# Patient Record
Sex: Female | Born: 1955 | Race: White | Hispanic: No | Marital: Married | Smoking: Never smoker
Health system: Southern US, Community
[De-identification: ages and names within clinical notes are randomized; demographics above are authoritative.]

## PROBLEM LIST (undated history)

## (undated) DIAGNOSIS — E785 Hyperlipidemia, unspecified: Secondary | ICD-10-CM

## (undated) DIAGNOSIS — I341 Nonrheumatic mitral (valve) prolapse: Secondary | ICD-10-CM

## (undated) DIAGNOSIS — R091 Pleurisy: Secondary | ICD-10-CM

## (undated) DIAGNOSIS — C539 Malignant neoplasm of cervix uteri, unspecified: Secondary | ICD-10-CM

## (undated) DIAGNOSIS — C73 Malignant neoplasm of thyroid gland: Secondary | ICD-10-CM

## (undated) HISTORY — DX: Malignant neoplasm of thyroid gland: C73

## (undated) HISTORY — DX: Hyperlipidemia, unspecified: E78.5

## (undated) HISTORY — DX: Malignant neoplasm of cervix uteri, unspecified: C53.9

## (undated) HISTORY — PX: OTHER SURGICAL HISTORY: SHX169

## (undated) HISTORY — DX: Nonrheumatic mitral (valve) prolapse: I34.1

## (undated) HISTORY — DX: Pleurisy: R09.1

---

## 1976-08-17 HISTORY — PX: TONSILLECTOMY: SHX5217

## 1980-08-17 DIAGNOSIS — R091 Pleurisy: Secondary | ICD-10-CM

## 1980-08-17 HISTORY — DX: Pleurisy: R09.1

## 1984-08-17 DIAGNOSIS — C539 Malignant neoplasm of cervix uteri, unspecified: Secondary | ICD-10-CM

## 1984-08-17 HISTORY — DX: Malignant neoplasm of cervix uteri, unspecified: C53.9

## 1998-02-17 ENCOUNTER — Emergency Department (HOSPITAL_COMMUNITY): Admission: EM | Admit: 1998-02-17 | Discharge: 1998-02-17 | Payer: Self-pay | Admitting: Emergency Medicine

## 1998-11-25 ENCOUNTER — Other Ambulatory Visit: Admission: RE | Admit: 1998-11-25 | Discharge: 1998-11-25 | Payer: Self-pay | Admitting: Obstetrics & Gynecology

## 2000-06-10 ENCOUNTER — Other Ambulatory Visit: Admission: RE | Admit: 2000-06-10 | Discharge: 2000-06-10 | Payer: Self-pay | Admitting: Obstetrics and Gynecology

## 2000-06-25 ENCOUNTER — Encounter: Payer: Self-pay | Admitting: Obstetrics and Gynecology

## 2000-06-25 ENCOUNTER — Ambulatory Visit (HOSPITAL_COMMUNITY): Admission: RE | Admit: 2000-06-25 | Discharge: 2000-06-25 | Payer: Self-pay | Admitting: Obstetrics and Gynecology

## 2002-07-28 ENCOUNTER — Other Ambulatory Visit: Admission: RE | Admit: 2002-07-28 | Discharge: 2002-07-28 | Payer: Self-pay | Admitting: Obstetrics and Gynecology

## 2004-01-05 ENCOUNTER — Other Ambulatory Visit: Payer: Self-pay

## 2004-07-15 ENCOUNTER — Other Ambulatory Visit: Admission: RE | Admit: 2004-07-15 | Discharge: 2004-07-15 | Payer: Self-pay | Admitting: Obstetrics and Gynecology

## 2005-08-17 DIAGNOSIS — C73 Malignant neoplasm of thyroid gland: Secondary | ICD-10-CM

## 2005-08-17 HISTORY — PX: THYROIDECTOMY: SHX17

## 2005-08-17 HISTORY — DX: Malignant neoplasm of thyroid gland: C73

## 2006-05-10 ENCOUNTER — Encounter: Admission: RE | Admit: 2006-05-10 | Discharge: 2006-05-10 | Payer: Self-pay | Admitting: Internal Medicine

## 2006-05-19 ENCOUNTER — Other Ambulatory Visit: Admission: RE | Admit: 2006-05-19 | Discharge: 2006-05-19 | Payer: Self-pay | Admitting: Interventional Radiology

## 2006-05-19 ENCOUNTER — Encounter: Admission: RE | Admit: 2006-05-19 | Discharge: 2006-05-19 | Payer: Self-pay | Admitting: Internal Medicine

## 2006-05-19 ENCOUNTER — Encounter (INDEPENDENT_AMBULATORY_CARE_PROVIDER_SITE_OTHER): Payer: Self-pay | Admitting: Specialist

## 2006-07-01 ENCOUNTER — Ambulatory Visit (HOSPITAL_COMMUNITY): Admission: RE | Admit: 2006-07-01 | Discharge: 2006-07-02 | Payer: Self-pay | Admitting: Surgery

## 2006-07-01 ENCOUNTER — Encounter (INDEPENDENT_AMBULATORY_CARE_PROVIDER_SITE_OTHER): Payer: Self-pay | Admitting: *Deleted

## 2006-07-27 ENCOUNTER — Encounter (HOSPITAL_COMMUNITY): Admission: RE | Admit: 2006-07-27 | Discharge: 2006-08-02 | Payer: Self-pay | Admitting: Endocrinology

## 2006-07-30 ENCOUNTER — Ambulatory Visit (HOSPITAL_COMMUNITY): Admission: RE | Admit: 2006-07-30 | Discharge: 2006-07-30 | Payer: Self-pay | Admitting: Endocrinology

## 2006-08-07 ENCOUNTER — Emergency Department (HOSPITAL_COMMUNITY): Admission: EM | Admit: 2006-08-07 | Discharge: 2006-08-08 | Payer: Self-pay | Admitting: Emergency Medicine

## 2007-07-09 IMAGING — US US BIOPSY
1 series · 5 of 5 positions shown · non-contrast
Comparison: Diagnostic ultrasound dated 05/10/06.

CLINICAL DATA: Dominant cystic and solid nodule of the left lobe of the thyroid gland.  The patient now presents for biopsy.
 ULTRASOUND GUIDED NEEDLE ASPIRATE BIOPSY OF THE THYROID:
TECHNIQUE: Prior to the procedure informed consent was obtained.  Initial imaging was performed by ultrasound to localize a left thyroid nodule.  Overlying skin was sterilely prepped and draped.  Local anesthesia was provided with 1% Lidocaine. 
 Three 25 gauge needle aspirates were performed in the solid portion of the dominant solid and cystic thyroid nodule.  Material was placed on slides for cytologic analysis.  The fluid component was then aspirated with a 4th 25 gauge needle.  After collapse of the cystic portion the fluid was also additionally sent for cytology.

[Series 1: unknown · 0.06mm/px · 5 of 5 slices shown]
[im 1/5]
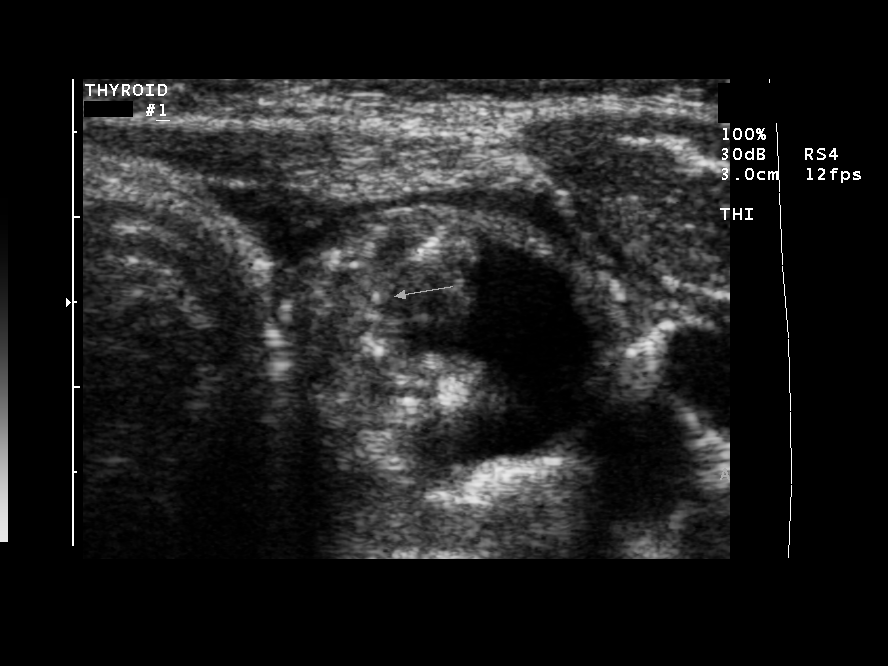
[im 2/5]
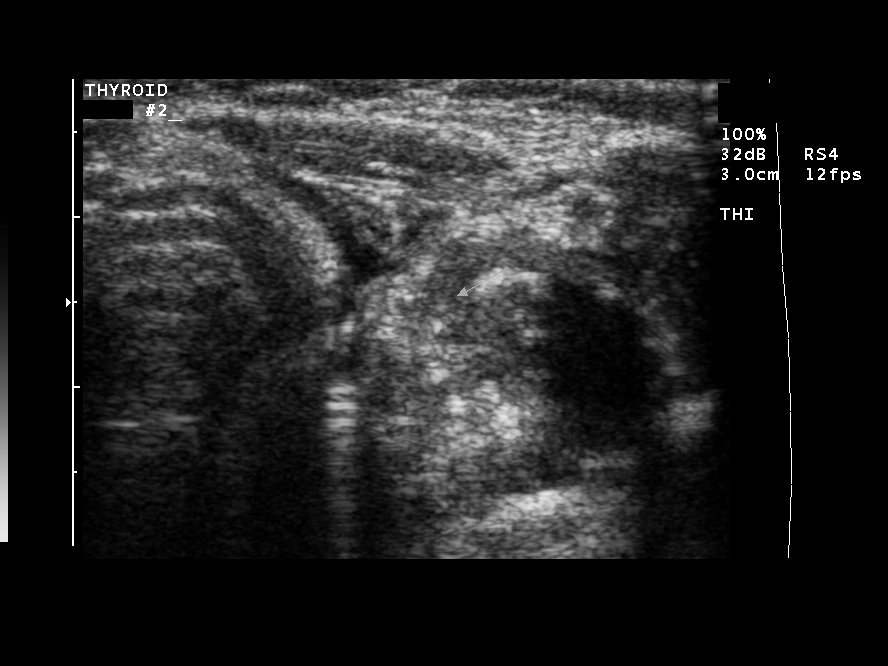
[im 3/5]
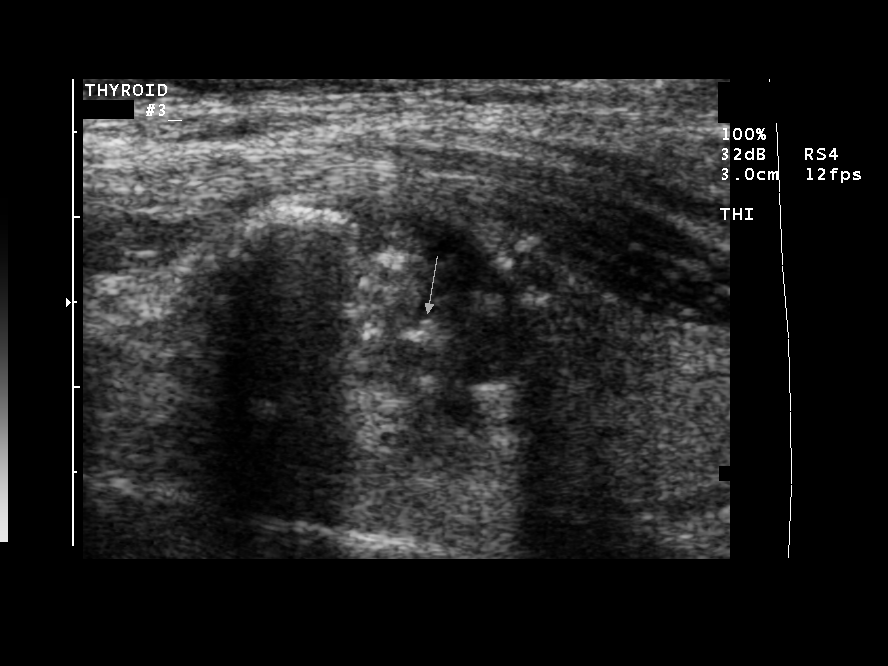
[im 4/5]
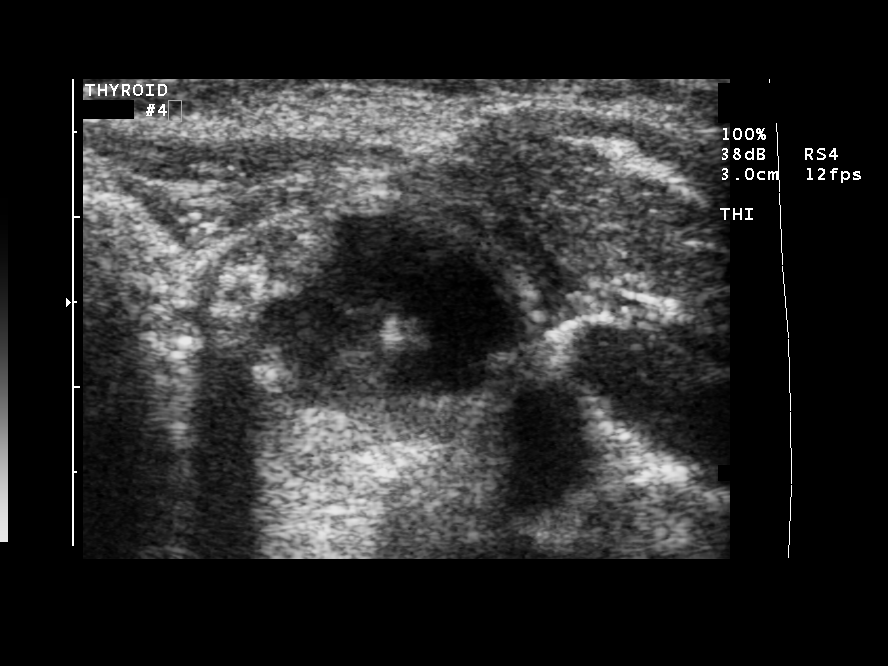
[im 5/5]
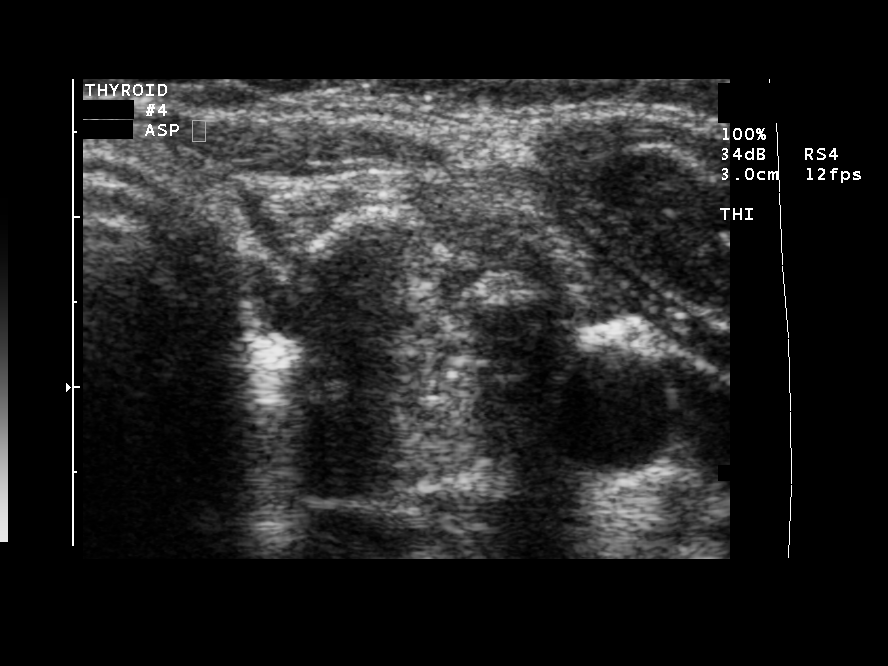

[5 of 5 positions shown; findings below may reference images not displayed]

FINDINGS: Again demonstrated is an ovoid dominant nodule with a polypoid solid portion containing internal calcification and a peripheral cystic portion.  The rim of the nodule also demonstrates areas of calcification.  Needle aspirates were performed in three different portions of the solid aspect of the nodule.  This was followed by cyst fluid aspiration revealing 3 cc of blood tinged fluid.  This resulted in complete collapse of the cystic portion of the mass.
IMPRESSION: Ultrasound guided thyroid biopsy with needle aspirates performed of both solid and cystic portions of a complex nodule of the left lobe.  Cytology is pending.

## 2007-08-08 ENCOUNTER — Encounter (HOSPITAL_COMMUNITY): Admission: RE | Admit: 2007-08-08 | Discharge: 2007-08-15 | Payer: Self-pay | Admitting: Internal Medicine

## 2007-09-28 IMAGING — CR DG CHEST 1V PORT
1 series · 1 of 1 positions shown · non-contrast
Comparison: none

HISTORY: Chest pain

PORTABLE CHEST ONE VIEW:
Portable exam 6666 hours compared to 06/29/2006
Normal heart size mediastinal contours and pulmonary vascularity.
Lungs clear.
No pleural effusion or pneumothorax.
Multiple cardiac monitoring lines project over chest.

[view not recorded]
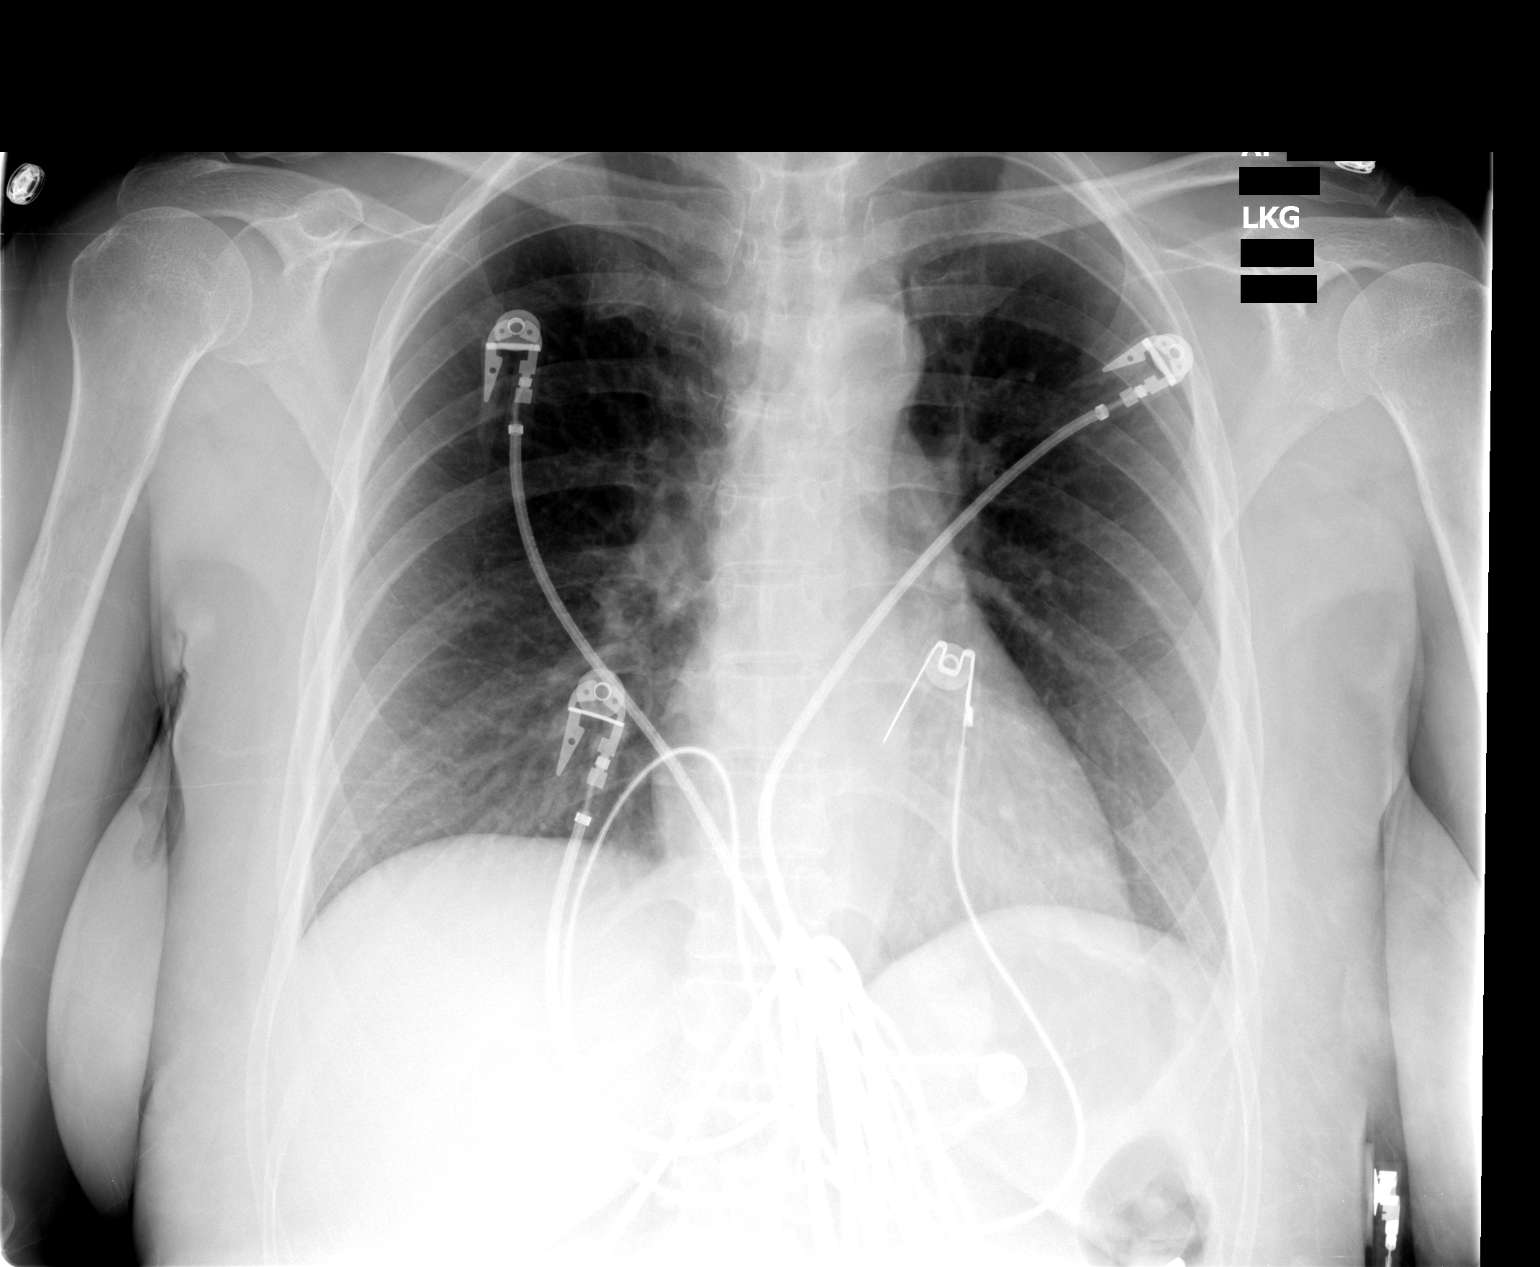

[1 of 1 positions shown; findings below may reference images not displayed]

IMPRESSION: No acute abnormalities.

## 2008-12-15 ENCOUNTER — Emergency Department: Payer: Self-pay | Admitting: Emergency Medicine

## 2010-09-07 ENCOUNTER — Encounter: Payer: Self-pay | Admitting: Endocrinology

## 2011-01-02 NOTE — Op Note (Signed)
Claire Smith, Claire Smith NO.:  [id number]   MEDICAL RECORD NO.:  [id number]          PATIENT TYPE:  OIB   LOCATION:  5706                         FACILITY:  [facility]   PHYSICIAN:  Velora Heckler, MD      DATE OF BIRTH:  October 02, 1955   DATE OF PROCEDURE:  07/01/2006  DATE OF DISCHARGE:                                 OPERATIVE REPORT   PREOPERATIVE DIAGNOSIS:  Left thyroid nodule with cytologic atypia worrisome  for papillary thyroid carcinoma.   POSTOPERATIVE DIAGNOSIS:  Left thyroid nodule with cytologic atypia  worrisome for papillary thyroid carcinoma.   PROCEDURE:  Total thyroidectomy.   SURGEON:  Velora Heckler, M.D., FACS   ASSISTANT:  Currie Paris, M.D., FACS   ANESTHESIA:  General per Dr. Diamantina Monks.   ESTIMATED BLOOD LOSS:  Minimal.   PREPARATION:  Betadine.   COMPLICATIONS:  None.   INDICATIONS:  The patient is a 56 year old white female from [place], [place].  She has had a known thyroid nodule for approximately 15 years.  The patient underwent thyroid ultrasound in September 2007 showing a complex  nodule in the left thyroid lobe.  Fine needle aspiration was performed and  showed findings suspicious for papillary thyroid carcinoma.  The patient now  comes to surgery for resection.   BODY OF REPORT:  The procedure is done in OR 16 at [facility].  The  patient is brought to the operating room and placed in the supine position  on the operating room table.  Following administration of general  anesthesia, the patient is prepped and draped in the usual strict aseptic  fashion.  After ascertaining that an adequate level of anesthesia had been  obtained, a Kocher incision was made with a #15 blade.  Dissection was  carried down through subcutaneous tissues and platysma.  Hemostasis was  obtained with electrocautery.  Skin flaps were elevated cephalad and caudad  from the thyroid notch to the sternal notch.  A Mahorner  self-retaining  retractor was placed for exposure.  Strap muscles are incised in the  midline.  Dissection is begun on the left side of the neck.  Strap muscles  were reflected laterally.  The nodule was quite large and occupies a large  portion of the left thyroid lobe.  The strap muscles were somewhat adherent  to the nodule but did not appear to be directly inflated.  They were gently  dissected away from the thyroid capsule.  The left lobe was mobilized.  Venous tributaries were divided with the harmonic scalpel.  The middle  thyroid vein is divided with the harmonic scalpel.  The superior pole  vessels are dissected out and transected with the harmonic scalpel with good  hemostasis noted.  The superior parathyroid gland on the left is identified  and preserved.  Branches of the inferior thyroid artery are divided between  small Liga clips.  The gland is rolled anteriorly.  Gentle dissection  reveals the recurrent nerve which was identified and preserved.  The gland  is rolled further anteriorly.  The inferior venous tributaries were divided  with harmonic scalpel.  The ligament of Allyson Sabal is transected with  electrocautery and the isthmus is mobilized up and onto the anterior  trachea.  A medium size pyramidal lobe was dissected out with the harmonic  scalpel and included with specimen.  Dissection was carried across the  midline and the isthmus is transected with the harmonic scalpel at its  junction with the right thyroid lobe.  The left lobe and isthmus and  pyramidal lobe were submitted as one specimen to pathology.  Dr. Renato Battles did a frozen section biopsy on the nodule.  She states that this is a  calcified nodule worrisome for papillary carcinoma but based on frozen  section, she is not able to make a definitive diagnosis.  Therefore, final  diagnosis will be deferred to permanent sections.  Good hemostasis was  obtained in the left neck and a pack is placed in the left  neck.   Next, we turned our attention to the right thyroid lobe.  The strap muscles  are reflected laterally and the lobe was exposed.  There are no dominant  masses in the right thyroid lobe.  The middle thyroid vein is divided with  harmonic scalpel.  The superior pole is dissected out.  The superior pole  vessels were taken down using the harmonic scalpel with good hemostasis  noted.  The gland is rolled anteriorly.  The inferior venous tributaries  were divided between small Liga clips.  The inferior parathyroid tissue was  identified and preserved.  Branches of the inferior thyroid artery are  divided between small Liga clips.  The gland is rolled anteriorly up and  onto the anterior trachea.  The ligament of Allyson Sabal is transected with  electrocautery.  The gland is excised off the trachea with the harmonic  scalpel and the right thyroid lobe is submitted for permanent sections only.  The neck is thoroughly palpated.  No significant lymphadenopathy is  identified.  The central compartment was opened and visually inspected  without evidence of lymphadenopathy.  There is no jugular adenopathy  palpable.  The neck is irrigated with saline.  Good hemostasis was obtained  bilaterally.  Surgicel was placed over the area of the recurrent nerves and  parathyroid glands bilaterally.  The strap muscles were reapproximated in  the midline with interrupted 3-0 Vicryl sutures.  The platysma was closed  with interrupted 3-0 Vicryl sutures.  The skin was closed with a running 4-0  Vicryl subcuticular suture.  The skin is washed and dried and Benzoin and  Steri-Strips are applied.  Sterile dressings were applied.  The patient is  awakened from anesthesia and brought to the recovery room in stable  condition.  The patient tolerated the procedure well.      Velora Heckler, MD  Electronically Signed     TMG/MEDQ  D:  07/01/2006  T:  07/01/2006  Job:  [id number]   cc:   Gwen Pounds, MD

## 2011-06-01 ENCOUNTER — Ambulatory Visit
Admission: RE | Admit: 2011-06-01 | Discharge: 2011-06-01 | Disposition: A | Discharge status: Home / Self Care | Payer: BC Managed Care – PPO | Source: Ambulatory Visit | Attending: Internal Medicine | Admitting: Internal Medicine

## 2011-06-01 ENCOUNTER — Other Ambulatory Visit: Payer: Self-pay | Admitting: Internal Medicine

## 2011-06-01 DIAGNOSIS — Z1231 Encounter for screening mammogram for malignant neoplasm of breast: Secondary | ICD-10-CM

## 2011-09-06 ENCOUNTER — Emergency Department: Payer: Self-pay | Admitting: Emergency Medicine

## 2012-09-23 ENCOUNTER — Other Ambulatory Visit: Payer: Self-pay | Admitting: Internal Medicine

## 2012-09-23 DIAGNOSIS — Z1231 Encounter for screening mammogram for malignant neoplasm of breast: Secondary | ICD-10-CM

## 2012-10-18 ENCOUNTER — Ambulatory Visit
Admission: RE | Admit: 2012-10-18 | Discharge: 2012-10-18 | Disposition: A | Discharge status: Home / Self Care | Payer: BC Managed Care – PPO | Source: Ambulatory Visit | Attending: Internal Medicine | Admitting: Internal Medicine

## 2012-10-18 DIAGNOSIS — Z1231 Encounter for screening mammogram for malignant neoplasm of breast: Secondary | ICD-10-CM

## 2013-01-19 ENCOUNTER — Encounter: Payer: Self-pay | Admitting: Gastroenterology

## 2014-05-07 ENCOUNTER — Other Ambulatory Visit: Payer: Self-pay

## 2014-05-07 DIAGNOSIS — Z1231 Encounter for screening mammogram for malignant neoplasm of breast: Secondary | ICD-10-CM

## 2014-05-15 ENCOUNTER — Ambulatory Visit
Admission: RE | Admit: 2014-05-15 | Discharge: 2014-05-15 | Disposition: A | Discharge status: Home / Self Care | Payer: BC Managed Care – PPO | Source: Ambulatory Visit

## 2014-05-15 DIAGNOSIS — Z1231 Encounter for screening mammogram for malignant neoplasm of breast: Secondary | ICD-10-CM

## 2014-06-26 ENCOUNTER — Encounter: Payer: Self-pay | Admitting: Internal Medicine

## 2014-08-23 ENCOUNTER — Encounter: Payer: BC Managed Care – PPO | Admitting: Internal Medicine

## 2015-10-14 ENCOUNTER — Encounter: Payer: Self-pay | Admitting: Internal Medicine

## 2016-01-20 DIAGNOSIS — E038 Other specified hypothyroidism: Secondary | ICD-10-CM | POA: Diagnosis not present

## 2016-01-20 DIAGNOSIS — C73 Malignant neoplasm of thyroid gland: Secondary | ICD-10-CM | POA: Diagnosis not present

## 2016-01-20 DIAGNOSIS — M810 Age-related osteoporosis without current pathological fracture: Secondary | ICD-10-CM | POA: Diagnosis not present

## 2016-01-20 DIAGNOSIS — R42 Dizziness and giddiness: Secondary | ICD-10-CM | POA: Diagnosis not present

## 2016-01-20 DIAGNOSIS — E784 Other hyperlipidemia: Secondary | ICD-10-CM | POA: Diagnosis not present

## 2016-03-27 DIAGNOSIS — N950 Postmenopausal bleeding: Secondary | ICD-10-CM | POA: Diagnosis not present

## 2016-04-16 ENCOUNTER — Encounter: Payer: Self-pay | Admitting: Internal Medicine

## 2016-04-27 ENCOUNTER — Encounter (INDEPENDENT_AMBULATORY_CARE_PROVIDER_SITE_OTHER): Payer: Self-pay

## 2016-04-27 ENCOUNTER — Ambulatory Visit (INDEPENDENT_AMBULATORY_CARE_PROVIDER_SITE_OTHER): Payer: BLUE CROSS/BLUE SHIELD | Admitting: Internal Medicine

## 2016-04-27 ENCOUNTER — Encounter: Payer: Self-pay | Admitting: Internal Medicine

## 2016-04-27 VITALS — BP 114/76 | HR 80 | Ht 61.81 in | Wt 148.0 lb

## 2016-04-27 DIAGNOSIS — K625 Hemorrhage of anus and rectum: Secondary | ICD-10-CM | POA: Diagnosis not present

## 2016-04-27 DIAGNOSIS — Z1211 Encounter for screening for malignant neoplasm of colon: Secondary | ICD-10-CM

## 2016-04-27 DIAGNOSIS — R1084 Generalized abdominal pain: Secondary | ICD-10-CM

## 2016-04-27 MED ORDER — NA SULFATE-K SULFATE-MG SULF 17.5-3.13-1.6 GM/177ML PO SOLN: 1.0000 | Freq: Once | ORAL | 0 refills | Status: AC

## 2016-04-27 NOTE — Patient Instructions (Signed)
You have been scheduled for a colonoscopy. Please follow written instructions given to you at your visit today.  Please pick up your prep supplies at the pharmacy within the next 1-3 days. If you use inhalers (even only as needed), please bring them with you on the day of your procedure.   

## 2016-04-27 NOTE — Progress Notes (Signed)
HISTORY OF PRESENT ILLNESS:  Claire Smith is a 60 y.o. female who is referred by Dr. Shon Baton regarding minor rectal bleeding and the need for colonoscopy. The patient has not had previous colonoscopy. She reports occasional minor blood on the stool. This is bright. Infrequent minor rectal discomfort. No change in bowel habits or weight. She does mention some vague lower abdominal discomfort which she has had off-and-on for a few years. Previously evaluated by PCP with negative ultrasound. She cannot identify any exacerbating or relieving factors. She does state that this is dull and not severe. She has a history of thyroid cancer but is doing well.  REVIEW OF SYSTEMS:  All non-GI ROS negative except for  Past Medical History:  Diagnosis Date  . Cervical cancer ([place]) 1986  . HLD (hyperlipidemia)   . MVP (mitral valve prolapse)   . Pleurisy 1982  . Thyroid cancer ([place]) 2007    Past Surgical History:  Procedure Laterality Date  . radioactive iodine     thyroid cancer  . THYROIDECTOMY  2007  . TONSILLECTOMY  1978    Social History Claire Smith  reports that she has never smoked. She has never used smokeless tobacco. She reports that she does not drink alcohol or use drugs.  family history includes Bladder Cancer (age of onset: 16) in her father; Breast cancer in her paternal grandmother; Heart Problems in her father; Heart disease in her paternal uncle; Prostate cancer in her paternal uncle; Rheumatic fever in her father.  Allergies  Allergen Reactions  . Erythromycin Nausea Only       PHYSICAL EXAMINATION: Vital signs: BP 114/76 (BP Location: Left Arm, Patient Position: Sitting, Cuff Size: Normal)   Pulse 80   Ht 5' 1.81" (1.57 m) Comment: height measured without shoes  Wt 148 lb (67.1 kg)   BMI 27.24 kg/m   Constitutional: generally well-appearing, no acute distress Psychiatric: alert and oriented x3, cooperative Eyes: extraocular movements intact, anicteric,  conjunctiva pink Mouth: oral pharynx moist, no lesions Neck: supple no lymphadenopathy Cardiovascular: heart regular rate and rhythm, no murmur Lungs: clear to auscultation bilaterally Abdomen: soft, nontender, nondistended, no obvious ascites, no peritoneal signs, normal bowel sounds, no organomegaly Rectal:Deferred until colonoscopy Extremities: no clubbing cyanosis or lower extremity edema bilaterally Skin: no lesions on visible extremities Neuro: No focal deficits. Normal DTRs  ASSESSMENT:  #1. Minor rectal bleeding #2. Vague minor intermittent lower abdominal discomfort. Chronic and stable #3. Screening colonoscopy. Appropriate candidate without contraindication   PLAN:  #1. Colonoscopy.The nature of the procedure, as well as the risks, benefits, and alternatives were carefully and thoroughly reviewed with the patient. Ample time for discussion and questions allowed. The patient understood, was satisfied, and agreed to proceed.

## 2016-04-28 ENCOUNTER — Encounter: Payer: Self-pay | Admitting: Internal Medicine

## 2016-04-29 ENCOUNTER — Telehealth: Payer: Self-pay | Admitting: Internal Medicine

## 2016-04-29 NOTE — Telephone Encounter (Signed)
Called pharmacy and gave them the $50 coupon information for Suprep.  Pharmacy ran coupon successfully.  Called patient and told her she could pick it up.  Patient agreed.

## 2016-05-04 ENCOUNTER — Ambulatory Visit (AMBULATORY_SURGERY_CENTER): Payer: BLUE CROSS/BLUE SHIELD | Admitting: Internal Medicine

## 2016-05-04 ENCOUNTER — Encounter: Payer: Self-pay | Admitting: Internal Medicine

## 2016-05-04 VITALS — BP 116/64 | HR 68 | Temp 98.0°F | Resp 11 | Ht 61.0 in | Wt 148.0 lb

## 2016-05-04 DIAGNOSIS — Z1211 Encounter for screening for malignant neoplasm of colon: Secondary | ICD-10-CM | POA: Diagnosis not present

## 2016-05-04 DIAGNOSIS — D120 Benign neoplasm of cecum: Secondary | ICD-10-CM

## 2016-05-04 DIAGNOSIS — D125 Benign neoplasm of sigmoid colon: Secondary | ICD-10-CM

## 2016-05-04 MED ORDER — SODIUM CHLORIDE 0.9 % IV SOLN: 500.0000 mL | INTRAVENOUS | Status: AC

## 2016-05-04 NOTE — Progress Notes (Signed)
Report to PACU, RN, vss, BBS= Clear.  

## 2016-05-04 NOTE — Progress Notes (Signed)
Called to room for pathology. 

## 2016-05-04 NOTE — Op Note (Signed)
[facility] Patient Name: Claire Smith Procedure Date: 05/04/2016 12:48 PM MRN: [id number] Endoscopist: Docia Chuck. Henrene Pastor , MD Age: 60 Referring MD:  Date of Birth: [birth year] Gender: Female Account #: [id number] Procedure:                Colonoscopy, with cold snare polypectomy x 1 Indications:              Screening for colorectal malignant neoplasm Medicines:                Monitored Anesthesia Care Procedure:                Pre-Anesthesia Assessment:                           - Prior to the procedure, a History and Physical                            was performed, and patient medications and                            allergies were reviewed. The patient's tolerance of                            previous anesthesia was also reviewed. The risks                            and benefits of the procedure and the sedation                            options and risks were discussed with the patient.                            All questions were answered, and informed consent                            was obtained. Prior Anticoagulants: The patient has                            taken no previous anticoagulant or antiplatelet                            agents. ASA Grade Assessment: II - A patient with                            mild systemic disease. After reviewing the risks                            and benefits, the patient was deemed in                            satisfactory condition to undergo the procedure.                           After obtaining informed consent, the colonoscope  was passed under direct vision. Throughout the                            procedure, the patient's blood pressure, pulse, and                            oxygen saturations were monitored continuously. The                            Model CF-HQ190L [id number] scope was introduced                            through the anus and advanced to the the cecum,                   identified by appendiceal orifice and ileocecal                            valve. The ileocecal valve, appendiceal orifice,                            and rectum were photographed. The quality of the                            bowel preparation was excellent. The colonoscopy                            was performed without difficulty. The patient                            tolerated the procedure well. The bowel preparation                            used was SUPREP. Scope In: 1:25:49 PM Scope Out: 1:45:18 PM Scope Withdrawal Time: 0 hours 15 minutes 49 seconds  Total Procedure Duration: 0 hours 19 minutes 29 seconds  Findings:                 A 5 mm polyp was found in the cecum. The polyp was                            sessile. The polyp was removed with a cold snare.                            Resection and retrieval were complete.                           The exam was otherwise without abnormality on                            direct and retroflexion views. Complications:            No immediate complications. Estimated blood loss:                            None.  Estimated Blood Loss:     Estimated blood loss: none. Impression:               - One 5 mm polyp in the cecum, removed with a cold                            snare. Resected and retrieved.                           - The examination was otherwise normal on direct                            and retroflexion views. Recommendation:           - Repeat colonoscopy in 5 years for surveillance.                           - Patient has a contact number available for                            emergencies. The signs and symptoms of potential                            delayed complications were discussed with the                            patient. Return to normal activities tomorrow.                            Written discharge instructions were provided to the                            patient.                            - Resume previous diet.                           - Continue present medications.                           - Await pathology results. Docia Chuck. Henrene Pastor, MD 05/04/2016 1:49:02 PM This report has been signed electronically.

## 2016-05-04 NOTE — Progress Notes (Signed)
Pt states she felt dizzy after we sat her up to get dressed. Pts vitals were WNL, had pt sit on edge of bed for a few minutes, pt still says she is dizzy, had nancy LNP help pt get dressed and moved to consult room, pt had passed gas, abd was non tender, denies pain, there was no reason to keep her other than she still felt dizzy, when moving pt to consult room she was steady on feet, did not seem to be as dizzy, pt also went to the restroom later and did fine, discharge pt home-adm

## 2016-05-04 NOTE — Patient Instructions (Signed)
YOU HAD AN ENDOSCOPIC PROCEDURE TODAY AT [facility]:   Refer to the procedure report that was given to you for any specific questions about what was found during the examination.  If the procedure report does not answer your questions, please call your gastroenterologist to clarify.  If you requested that your care partner not be given the details of your procedure findings, then the procedure report has been included in a sealed envelope for you to review at your convenience later.  YOU SHOULD EXPECT: Some feelings of bloating in the abdomen. Passage of more gas than usual.  Walking can help get rid of the air that was put into your GI tract during the procedure and reduce the bloating. If you had a lower endoscopy (such as a colonoscopy or flexible sigmoidoscopy) you may notice spotting of blood in your stool or on the toilet paper. If you underwent a bowel prep for your procedure, you may not have a normal bowel movement for a few days.  Please Note:  You might notice some irritation and congestion in your nose or some drainage.  This is from the oxygen used during your procedure.  There is no need for concern and it should clear up in a day or so.  SYMPTOMS TO REPORT IMMEDIATELY:   Following lower endoscopy (colonoscopy or flexible sigmoidoscopy):  Excessive amounts of blood in the stool  Significant tenderness or worsening of abdominal pains  Swelling of the abdomen that is new, acute  Fever of 100F or higher  For urgent or emergent issues, a gastroenterologist can be reached at any hour by calling [phone].   DIET:  We do recommend a small meal at first, but then you may proceed to your regular diet.  Drink plenty of fluids but you should avoid alcoholic beverages for 24 hours.  ACTIVITY:  You should plan to take it easy for the rest of today and you should NOT DRIVE or use heavy machinery until tomorrow (because of the sedation medicines used during the test).     FOLLOW UP: Our staff will call the number listed on your records the next business day following your procedure to check on you and address any questions or concerns that you may have regarding the information given to you following your procedure. If we do not reach you, we will leave a message.  However, if you are feeling well and you are not experiencing any problems, there is no need to return our call.  We will assume that you have returned to your regular daily activities without incident.  If any biopsies were taken you will be contacted by phone or by letter within the next 1-3 weeks.  Please call us at [phone] if you have not heard about the biopsies in 3 weeks.    SIGNATURES/CONFIDENTIALITY: You and/or your care partner have signed paperwork which will be entered into your electronic medical record.  These signatures attest to the fact that that the information above on your After Visit Summary has been reviewed and is understood.  Full responsibility of the confidentiality of this discharge information lies with you and/or your care-partner.  Polyp-handout given  Repeat colonoscopy in 5 years 2022.

## 2016-05-05 ENCOUNTER — Telehealth: Payer: Self-pay

## 2016-05-05 NOTE — Telephone Encounter (Signed)
  Follow up Call-  Call back number 05/04/2016  Post procedure Call Back phone  # [phone]  Permission to leave phone message Yes  Some recent data might be hidden     Patient questions:  Do you have a fever, pain , or abdominal swelling? No. Pain Score  0 *  Have you tolerated food without any problems? Yes.    Have you been able to return to your normal activities? Yes.    Do you have any questions about your discharge instructions: Diet   No. Medications  No. Follow up visit  No.  Do you have questions or concerns about your Care? No.  Actions: * If pain score is 4 or above: No action needed, pain <4.

## 2016-05-07 ENCOUNTER — Encounter: Payer: Self-pay | Admitting: Internal Medicine

## 2016-06-08 ENCOUNTER — Other Ambulatory Visit: Payer: Self-pay | Admitting: Internal Medicine

## 2016-06-08 DIAGNOSIS — Z1231 Encounter for screening mammogram for malignant neoplasm of breast: Secondary | ICD-10-CM

## 2016-07-06 ENCOUNTER — Ambulatory Visit
Admission: RE | Admit: 2016-07-06 | Discharge: 2016-07-06 | Disposition: A | Discharge status: Home / Self Care | Payer: BLUE CROSS/BLUE SHIELD | Source: Ambulatory Visit | Attending: Internal Medicine | Admitting: Internal Medicine

## 2016-07-06 DIAGNOSIS — Z1231 Encounter for screening mammogram for malignant neoplasm of breast: Secondary | ICD-10-CM

## 2016-07-20 DIAGNOSIS — Z0000 Encounter for general adult medical examination without abnormal findings: Secondary | ICD-10-CM | POA: Diagnosis not present

## 2016-07-20 DIAGNOSIS — E784 Other hyperlipidemia: Secondary | ICD-10-CM | POA: Diagnosis not present

## 2016-07-20 DIAGNOSIS — E038 Other specified hypothyroidism: Secondary | ICD-10-CM | POA: Diagnosis not present

## 2016-07-20 DIAGNOSIS — M810 Age-related osteoporosis without current pathological fracture: Secondary | ICD-10-CM | POA: Diagnosis not present

## 2016-07-27 DIAGNOSIS — Z0000 Encounter for general adult medical examination without abnormal findings: Secondary | ICD-10-CM | POA: Diagnosis not present

## 2016-07-27 DIAGNOSIS — E784 Other hyperlipidemia: Secondary | ICD-10-CM | POA: Diagnosis not present

## 2016-07-27 DIAGNOSIS — M810 Age-related osteoporosis without current pathological fracture: Secondary | ICD-10-CM | POA: Diagnosis not present

## 2016-07-27 DIAGNOSIS — Z6826 Body mass index (BMI) 26.0-26.9, adult: Secondary | ICD-10-CM | POA: Diagnosis not present

## 2016-07-27 DIAGNOSIS — Z1389 Encounter for screening for other disorder: Secondary | ICD-10-CM | POA: Diagnosis not present

## 2016-07-27 DIAGNOSIS — R42 Dizziness and giddiness: Secondary | ICD-10-CM | POA: Diagnosis not present

## 2016-07-27 DIAGNOSIS — E559 Vitamin D deficiency, unspecified: Secondary | ICD-10-CM | POA: Diagnosis not present

## 2016-08-06 DIAGNOSIS — Z01419 Encounter for gynecological examination (general) (routine) without abnormal findings: Secondary | ICD-10-CM | POA: Diagnosis not present

## 2016-08-06 DIAGNOSIS — Z124 Encounter for screening for malignant neoplasm of cervix: Secondary | ICD-10-CM | POA: Diagnosis not present

## 2016-08-06 DIAGNOSIS — Z6826 Body mass index (BMI) 26.0-26.9, adult: Secondary | ICD-10-CM | POA: Diagnosis not present

## 2016-08-06 DIAGNOSIS — Z1151 Encounter for screening for human papillomavirus (HPV): Secondary | ICD-10-CM | POA: Diagnosis not present

## 2016-12-24 DIAGNOSIS — R42 Dizziness and giddiness: Secondary | ICD-10-CM | POA: Diagnosis not present

## 2016-12-24 DIAGNOSIS — M810 Age-related osteoporosis without current pathological fracture: Secondary | ICD-10-CM | POA: Diagnosis not present

## 2016-12-24 DIAGNOSIS — C73 Malignant neoplasm of thyroid gland: Secondary | ICD-10-CM | POA: Diagnosis not present

## 2016-12-24 DIAGNOSIS — M792 Neuralgia and neuritis, unspecified: Secondary | ICD-10-CM | POA: Diagnosis not present

## 2016-12-24 DIAGNOSIS — E038 Other specified hypothyroidism: Secondary | ICD-10-CM | POA: Diagnosis not present

## 2016-12-24 DIAGNOSIS — E784 Other hyperlipidemia: Secondary | ICD-10-CM | POA: Diagnosis not present

## 2017-03-04 DIAGNOSIS — E038 Other specified hypothyroidism: Secondary | ICD-10-CM | POA: Diagnosis not present

## 2017-03-04 DIAGNOSIS — R5383 Other fatigue: Secondary | ICD-10-CM | POA: Diagnosis not present

## 2017-03-04 DIAGNOSIS — K921 Melena: Secondary | ICD-10-CM | POA: Diagnosis not present

## 2017-03-04 DIAGNOSIS — R109 Unspecified abdominal pain: Secondary | ICD-10-CM | POA: Diagnosis not present

## 2017-05-07 ENCOUNTER — Encounter (HOSPITAL_COMMUNITY): Payer: Self-pay

## 2017-05-07 ENCOUNTER — Emergency Department (HOSPITAL_COMMUNITY)
Admission: EM | Admit: 2017-05-07 | Discharge: 2017-05-08 | Disposition: A | Discharge status: Home / Self Care | Payer: BLUE CROSS/BLUE SHIELD | Attending: Emergency Medicine | Admitting: Emergency Medicine

## 2017-05-07 ENCOUNTER — Telehealth: Payer: Self-pay | Admitting: Internal Medicine

## 2017-05-07 DIAGNOSIS — R1030 Lower abdominal pain, unspecified: Secondary | ICD-10-CM | POA: Diagnosis not present

## 2017-05-07 DIAGNOSIS — Z85850 Personal history of malignant neoplasm of thyroid: Secondary | ICD-10-CM | POA: Diagnosis not present

## 2017-05-07 DIAGNOSIS — K529 Noninfective gastroenteritis and colitis, unspecified: Secondary | ICD-10-CM | POA: Diagnosis not present

## 2017-05-07 DIAGNOSIS — Z8541 Personal history of malignant neoplasm of cervix uteri: Secondary | ICD-10-CM | POA: Insufficient documentation

## 2017-05-07 DIAGNOSIS — Z7982 Long term (current) use of aspirin: Secondary | ICD-10-CM | POA: Diagnosis not present

## 2017-05-07 DIAGNOSIS — R109 Unspecified abdominal pain: Secondary | ICD-10-CM | POA: Diagnosis not present

## 2017-05-07 DIAGNOSIS — Z79899 Other long term (current) drug therapy: Secondary | ICD-10-CM | POA: Insufficient documentation

## 2017-05-07 LAB — CBC
HEMATOCRIT: 42 % (ref 36.0–46.0)
HEMOGLOBIN: 14.2 g/dL (ref 12.0–15.0)
MCH: 30.6 pg (ref 26.0–34.0)
MCHC: 33.8 g/dL (ref 30.0–36.0)
MCV: 90.5 fL (ref 78.0–100.0)
Platelets: 307 10*3/uL (ref 150–400)
RBC: 4.64 MIL/uL (ref 3.87–5.11)
RDW: 12.1 % (ref 11.5–15.5)
WBC: 8 10*3/uL (ref 4.0–10.5)

## 2017-05-07 LAB — COMPREHENSIVE METABOLIC PANEL
ALBUMIN: 4.2 g/dL (ref 3.5–5.0)
ALT: 19 U/L (ref 14–54)
ANION GAP: 8 (ref 5–15)
AST: 18 U/L (ref 15–41)
Alkaline Phosphatase: 72 U/L (ref 38–126)
BUN: 8 mg/dL (ref 6–20)
CO2: 26 mmol/L (ref 22–32)
Calcium: 9.2 mg/dL (ref 8.9–10.3)
Chloride: 101 mmol/L (ref 101–111)
Creatinine, Ser: 0.63 mg/dL (ref 0.44–1.00)
GFR calc Af Amer: >60 mL/min (ref >60)
GFR calc non Af Amer: >60 mL/min (ref >60)
GLUCOSE: 119 mg/dL — AB (ref 65–99)
POTASSIUM: 4 mmol/L (ref 3.5–5.1)
SODIUM: 135 mmol/L (ref 135–145)
Total Bilirubin: 1.2 mg/dL (ref 0.3–1.2)
Total Protein: 7.5 g/dL (ref 6.5–8.1)

## 2017-05-07 LAB — LIPASE, BLOOD: Lipase: 93 U/L — ABNORMAL HIGH (ref 11–51)

## 2017-05-07 MED ORDER — SODIUM CHLORIDE 0.9 % IV BOLUS (SEPSIS)
1000.0000 mL | Freq: Once | INTRAVENOUS | Status: AC
  Administered 2017-05-08: 1000 mL via INTRAVENOUS

## 2017-05-07 MED ORDER — HYDROMORPHONE HCL 1 MG/ML IJ SOLN
0.5000 mg | Freq: Once | INTRAMUSCULAR | Status: DC
  Filled 2017-05-07: qty 1

## 2017-05-07 NOTE — ED Triage Notes (Signed)
The morning (11am) pt c/o excruciating lower abd pain, nausea and diarrhea. She states that later this evening she began passing blood. She spoke with her GI doctor and has an appt on Tuesday, but states that she couldn't wait. She has a similar episode in July and was placed on a probiotic and flagyl. A&Ox4. Currently her pain is about of 5/10, but this morning it was a 10/10.

## 2017-05-08 ENCOUNTER — Encounter (HOSPITAL_COMMUNITY): Payer: Self-pay | Admitting: Radiology

## 2017-05-08 ENCOUNTER — Emergency Department (HOSPITAL_COMMUNITY): Payer: BLUE CROSS/BLUE SHIELD

## 2017-05-08 DIAGNOSIS — R109 Unspecified abdominal pain: Secondary | ICD-10-CM | POA: Diagnosis not present

## 2017-05-08 LAB — URINALYSIS, ROUTINE W REFLEX MICROSCOPIC
BILIRUBIN URINE: NEGATIVE
GLUCOSE, UA: NEGATIVE mg/dL
HGB URINE DIPSTICK: NEGATIVE
Ketones, ur: NEGATIVE mg/dL
Leukocytes, UA: NEGATIVE
Nitrite: NEGATIVE
PROTEIN: NEGATIVE mg/dL
Specific Gravity, Urine: 1.016 (ref 1.005–1.030)
pH: 5 (ref 5.0–8.0)

## 2017-05-08 MED ORDER — IOPAMIDOL (ISOVUE-300) INJECTION 61%
INTRAVENOUS | Status: AC
  Filled 2017-05-08: qty 100

## 2017-05-08 MED ORDER — CIPROFLOXACIN HCL 500 MG PO TABS
500.0000 mg | ORAL_TABLET | Freq: Once | ORAL | Status: AC
  Administered 2017-05-08: 500 mg via ORAL
  Filled 2017-05-08: qty 1

## 2017-05-08 MED ORDER — CIPROFLOXACIN HCL 500 MG PO TABS: 500.0000 mg | ORAL_TABLET | Freq: Two times a day (BID) | ORAL | 0 refills | Status: DC

## 2017-05-08 MED ORDER — IOPAMIDOL (ISOVUE-300) INJECTION 61%
100.0000 mL | Freq: Once | INTRAVENOUS | Status: AC | PRN
  Administered 2017-05-08: 100 mL via INTRAVENOUS

## 2017-05-08 NOTE — Discharge Instructions (Signed)
It was our pleasure to provide your ER care today - we hope that you feel better.  Rest. Drink plenty of fluids.  Take antibiotic as prescribed.  Follow up with GI doctor this week as planned.  Return to ER if worse, new symptoms, high fevers, severe pain, persistent vomiting, other concern.

## 2017-05-08 NOTE — ED Provider Notes (Signed)
[facility] DEPT Provider Note   CSN: [id number] Arrival date & time: 05/07/17  2132     History   Chief Complaint Chief Complaint  Patient presents with  . Abdominal Pain  . Diarrhea    HPI Claire Smith is a 61 y.o. female.  Patient c/o mid to lower abd pain recurrently for past few months. Symptoms episodic. States pain acutely worse this AM.  Did have bm this AM, but was loose. No severe diarrhea. Nausea. No vomiting. Denies fever or chills. No dysuria or gu c/o. No hx diverticulitis. Notes normal colonoscopy 1 yr ago except benign polyp.  States has appt next week w her gi doctor.    The history is provided by the patient.    Past Medical History:  Diagnosis Date  . Cervical cancer ([place]) 1986  . HLD (hyperlipidemia)   . MVP (mitral valve prolapse)   . Pleurisy 1982  . Thyroid cancer ([place]) 2007    There are no active problems to display for this patient.   Past Surgical History:  Procedure Laterality Date  . radioactive iodine     thyroid cancer  . THYROIDECTOMY  2007  . TONSILLECTOMY  1978    OB History    No data available       Home Medications    Prior to Admission medications   Medication Sig Start Date End Date Taking? Authorizing Provider  aspirin 81 MG tablet Take 81 mg by mouth. 3 times a week    [provider]  levothyroxine (SYNTHROID, LEVOTHROID) 100 MCG tablet Take 100 mcg by mouth daily. 6 days a week    [provider]  simvastatin (ZOCOR) 20 MG tablet Take 20 mg by mouth as directed. Takes 20 mg 2 times a week    [provider]    Family History Family History  Problem Relation Age of Onset  . Rheumatic fever Father   . Bladder Cancer Father 55  . Heart Problems Father   . Heart disease Paternal Uncle   . Prostate cancer Paternal Uncle   . Breast cancer Paternal Grandmother     Social History Social History  Substance Use Topics  . Smoking status: Never Smoker  . Smokeless tobacco: Never  Used  . Alcohol use No     Allergies   Erythromycin   Review of Systems Review of Systems  Constitutional: Negative for chills and fever.  HENT: Negative for sore throat.   Eyes: Negative for redness.  Respiratory: Negative for shortness of breath.   Cardiovascular: Negative for chest pain.  Gastrointestinal: Positive for abdominal pain and nausea.  Genitourinary: Negative for dysuria and flank pain.  Musculoskeletal: Negative for back pain.  Skin: Negative for rash.  Neurological: Negative for headaches.  Hematological: Does not bruise/bleed easily.  Psychiatric/Behavioral: Negative for confusion.     Physical Exam Updated Vital Signs BP (!) 166/91 (BP Location: Right Arm)   Pulse 73   Temp 98 F (36.7 C) (Oral)   SpO2 100%   Physical Exam  Constitutional: She appears well-developed and well-nourished. No distress.  HENT:  Mouth/Throat: Oropharynx is clear and moist.  Eyes: Conjunctivae are normal. No scleral icterus.  Neck: Neck supple. No tracheal deviation present.  Cardiovascular: Normal rate, regular rhythm, normal heart sounds and intact distal pulses.   Pulmonary/Chest: Effort normal and breath sounds normal. No respiratory distress.  Abdominal: Soft. Normal appearance and bowel sounds are normal. She exhibits no distension and no mass. There is tenderness. There  is no rebound and no guarding. No hernia.  Mid abd tenderness. No reb/guard. No incarc hernia.   Musculoskeletal: She exhibits no edema.  Neurological: She is alert.  Skin: Skin is warm and dry. No rash noted. She is not diaphoretic.  Psychiatric: She has a normal mood and affect.  Nursing note and vitals reviewed.    ED Treatments / Results  Labs (all labs ordered are listed, but only abnormal results are displayed) Results for orders placed or performed during the hospital encounter of 05/07/17  Lipase, blood  Result Value Ref Range   Lipase 93 (H) 11 - 51 U/L  Comprehensive metabolic  panel  Result Value Ref Range   Sodium 135 135 - 145 mmol/L   Potassium 4.0 3.5 - 5.1 mmol/L   Chloride 101 101 - 111 mmol/L   CO2 26 22 - 32 mmol/L   Glucose, Bld 119 (H) 65 - 99 mg/dL   BUN 8 6 - 20 mg/dL   Creatinine, Ser 0.63 0.44 - 1.00 mg/dL   Calcium 9.2 8.9 - 10.3 mg/dL   Total Protein 7.5 6.5 - 8.1 g/dL   Albumin 4.2 3.5 - 5.0 g/dL   AST 18 15 - 41 U/L   ALT 19 14 - 54 U/L   Alkaline Phosphatase 72 38 - 126 U/L   Total Bilirubin 1.2 0.3 - 1.2 mg/dL   GFR calc non Af Amer >60 >60 mL/min   GFR calc Af Amer >60 >60 mL/min   Anion gap 8 5 - 15  CBC  Result Value Ref Range   WBC 8.0 4.0 - 10.5 K/uL   RBC 4.64 3.87 - 5.11 MIL/uL   Hemoglobin 14.2 12.0 - 15.0 g/dL   HCT 42.0 36.0 - 46.0 %   MCV 90.5 78.0 - 100.0 fL   MCH 30.6 26.0 - 34.0 pg   MCHC 33.8 30.0 - 36.0 g/dL   RDW 12.1 11.5 - 15.5 %   Platelets 307 150 - 400 K/uL  Urinalysis, Routine w reflex microscopic  Result Value Ref Range   Color, Urine YELLOW YELLOW   APPearance HAZY (A) CLEAR   Specific Gravity, Urine 1.016 1.005 - 1.030   pH 5.0 5.0 - 8.0   Glucose, UA NEGATIVE NEGATIVE mg/dL   Hgb urine dipstick NEGATIVE NEGATIVE   Bilirubin Urine NEGATIVE NEGATIVE   Ketones, ur NEGATIVE NEGATIVE mg/dL   Protein, ur NEGATIVE NEGATIVE mg/dL   Nitrite NEGATIVE NEGATIVE   Leukocytes, UA NEGATIVE NEGATIVE    EKG  EKG Interpretation None       Radiology Ct Abdomen Pelvis W Contrast  Result Date: 05/08/2017 CLINICAL DATA:  Abdominal pain EXAM: CT ABDOMEN AND PELVIS WITH CONTRAST TECHNIQUE: Multidetector CT imaging of the abdomen and pelvis was performed using the standard protocol following bolus administration of intravenous contrast. CONTRAST:  170mL ISOVUE-300 IOPAMIDOL (ISOVUE-300) INJECTION 61% COMPARISON:  None. FINDINGS: Lower chest: Lung bases demonstrate no acute consolidation or pleural effusion. Hepatobiliary: No focal liver abnormality is seen. No gallstones, gallbladder wall thickening, or biliary  dilatation. Pancreas: Unremarkable. No pancreatic ductal dilatation or surrounding inflammatory changes. Spleen: Normal in size without focal abnormality. Adrenals/Urinary Tract: Adrenal glands are unremarkable. Kidneys are normal, without renal calculi, focal lesion, or hydronephrosis. Bladder is unremarkable. Stomach/Bowel: Stomach is within normal limits. Appendix appears normal. Questionable mild wall thickening of the distal descending colon. Not much surrounding inflammation. Vascular/Lymphatic: Aortic atherosclerosis. No enlarged abdominal or pelvic lymph nodes. Reproductive: Uterus and bilateral adnexa are unremarkable. Other: Negative for free  air or free fluid. Musculoskeletal: No acute or significant osseous findings. IMPRESSION: 1. Collapsed colon versus mild focal colitis of the distal descending colon. 2. There are no other acute abnormalities visualized. Electronically Signed   By: Donavan Foil M.D.   On: 05/08/2017 02:22    Procedures Procedures (including critical care time)  Medications Ordered in ED Medications  sodium chloride 0.9 % bolus 1,000 mL (not administered)  HYDROmorphone (DILAUDID) injection 0.5 mg (not administered)     Initial Impression / Assessment and Plan / ED Course  I have reviewed the triage vital signs and the nursing notes.  Pertinent labs & imaging results that were available during my care of the patient were reviewed by me and considered in my medical decision making (see chart for details).  Iv ns bolus. Dilaudid .5 mg iv. zofran iv.  Labs.  Reviewed nursing notes and prior charts for additional history.   Will get CT.   CT w possible mild colitis.  Pt reports recently given rx flagyl.  Pt only abx allergies are emycin and indicates cant take augmentin.  Will give rx cipro.   Pt has f/u w gi this Tuesday.  Pt currently appears stable for d/c.     Final Clinical Impressions(s) / ED Diagnoses   Final diagnoses:  None    New  Prescriptions New Prescriptions   No medications on file     Lajean Saver, MD 05/08/17 [phone]

## 2017-05-10 NOTE — Telephone Encounter (Signed)
Spoke with pt on Friday and instructed her to go to the ER if she had increasing bleeding or if the pain was to bad. Also discussed with pt that is she was dizzy, felt weak or had SOB she needed to go to the ER, Pt verbalized understanding.

## 2017-05-11 ENCOUNTER — Ambulatory Visit (INDEPENDENT_AMBULATORY_CARE_PROVIDER_SITE_OTHER): Payer: BLUE CROSS/BLUE SHIELD | Admitting: Physician Assistant

## 2017-05-11 ENCOUNTER — Encounter: Payer: Self-pay | Admitting: Physician Assistant

## 2017-05-11 ENCOUNTER — Other Ambulatory Visit: Payer: BLUE CROSS/BLUE SHIELD

## 2017-05-11 VITALS — BP 110/72 | HR 74 | Ht 62.5 in | Wt 145.0 lb

## 2017-05-11 DIAGNOSIS — R197 Diarrhea, unspecified: Secondary | ICD-10-CM | POA: Diagnosis not present

## 2017-05-11 DIAGNOSIS — K921 Melena: Secondary | ICD-10-CM | POA: Diagnosis not present

## 2017-05-11 DIAGNOSIS — R935 Abnormal findings on diagnostic imaging of other abdominal regions, including retroperitoneum: Secondary | ICD-10-CM | POA: Diagnosis not present

## 2017-05-11 DIAGNOSIS — R1084 Generalized abdominal pain: Secondary | ICD-10-CM | POA: Diagnosis not present

## 2017-05-11 MED ORDER — HYOSCYAMINE SULFATE 0.125 MG SL SUBL: 0.1250 mg | SUBLINGUAL_TABLET | SUBLINGUAL | 0 refills | Status: AC | PRN

## 2017-05-11 MED ORDER — CIPROFLOXACIN HCL 500 MG PO TABS: 500.0000 mg | ORAL_TABLET | Freq: Two times a day (BID) | ORAL | 0 refills | Status: AC

## 2017-05-11 MED ORDER — METRONIDAZOLE 500 MG PO TABS: 500.0000 mg | ORAL_TABLET | Freq: Three times a day (TID) | ORAL | 0 refills | Status: AC

## 2017-05-11 NOTE — Progress Notes (Signed)
Chief Complaint: Abdominal pain, diarrhea, hematochezia  HPI:  Claire Smith is a 61 year old Caucasian female with a past medical history as listed below,  who was referred to me by Shon Baton, MD for a complaint of abdominal pain, diarrhea and hematochezia .      Patient's last colonoscopy was completed 05/04/16 by Dr. Henrene Pastor. This did reveal one 5 mm polyp in the cecum which was removed and patient was told to repeat in 5 years.    Patient was recently seen in the ER 05/07/17 related to these complaints. At that time patient had labs including a lipase which was minimally elevated at 93, CMP was normal, CBC was normal and urinalysis was normal. Patient did have CT the abdomen and pelvis with contrast which showed collapsed colon versus mild focal colitis in the distal descending colon. There were no other acute abnormalities visualized. Patient was started on Ciprofloxacin 500 mg twice a day and told to follow with [place].    Today, the patient brings with her a written sentiment of all of her symptoms. The patient describes that on July 9 she started with extreme low suprapubic pain which resulted in a loose bowel movement which continued all day long. The patient tells me that this pain continued and her bowel movements got looser and looser and were just eventually watery diarrhea which did look somewhat bloody and mucousy. By the end of it, she just saw a "red mucus in water". She remained with abdominal pain all day long so much so that all she did was stay in bed and go to the toilet. She continued with some generalized abdominal pain but her bleeding had stopped over the next 2-1/2 weeks. She does admit that her bowels were "just not right". She would tell me that she would have off-and-on diarrhea as well as an increased "grumbling". She did see her primary care provider who prescribed her a probiotic. She took this for 32 days and saw no difference in her crampy lower abdominal pain as well as looser  stools. Around August 25 the patient called her PCP and was started on Metronidazole 3 times a day for 10 days. Patient tells me she could only take 2 a day as they were very hard on her including a "metallic taste in my mouth". Patient took 31 out of the 30 pills and describes they also made her feel dizzy. Then on September 9 or 10th, the day after she stopped these antibiotics she started with another excruciating lower suprapubic abdominal pain which resulted again in bloody diarrhea. She continued with cramping and on Friday again this was so severe that she proceeded to the ER as above. Since then, she has continued with lower abdominal discomfort but has had no bowel movements. She has been abiding by a bland diet and trying to stay well hydrated. Associated symptoms included nausea and a feeling of faintness during pain episodes which occurred 3 times now over the past 2-1/2 months. Patient tells me she was prescribed ciprofloxacin in the ER but never started this medication due to reading about possibilities of tendinitis.     Patient denies fever, weight loss, vomiting, heartburn, reflux or symptoms that awaken her at night.  Past Medical History:  Diagnosis Date  . Cervical cancer ([place]) 1986  . HLD (hyperlipidemia)   . MVP (mitral valve prolapse)   . Pleurisy 1982  . Thyroid cancer ([place]) 2007    Past Surgical History:  Procedure Laterality Date  .  radioactive iodine     thyroid cancer  . THYROIDECTOMY  2007  . TONSILLECTOMY  1978    Current Outpatient Prescriptions  Medication Sig Dispense Refill  . levothyroxine (SYNTHROID, LEVOTHROID) 100 MCG tablet Take 100 mcg by mouth daily. 6 days a week    . simvastatin (ZOCOR) 20 MG tablet Take 20 mg by mouth as directed. Takes 20 mg 2 times a week    . ciprofloxacin (CIPRO) 500 MG tablet Take 1 tablet (500 mg total) by mouth 2 (two) times daily. 14 tablet 0  . hyoscyamine (LEVSIN SL) 0.125 MG SL tablet Place 1 tablet (0.125 mg total)  under the tongue every 4 (four) hours as needed for cramping. 30 tablet 0  . metroNIDAZOLE (FLAGYL) 500 MG tablet Take 1 tablet (500 mg total) by mouth 3 (three) times daily. 21 tablet 0   Current Facility-Administered Medications  Medication Dose Route Frequency Provider Last Rate Last Dose  . 0.9 %  sodium chloride infusion  500 mL Intravenous Continuous Irene Shipper, MD        Allergies as of 05/11/2017 - Review Complete 05/11/2017  Allergen Reaction Noted  . Erythromycin Nausea Only 01/19/2012    Family History  Problem Relation Age of Onset  . Rheumatic fever Father   . Bladder Cancer Father 82  . Heart Problems Father   . Heart disease Paternal Uncle   . Prostate cancer Paternal Uncle   . Breast cancer Paternal Grandmother     Social History   Social History  . Marital status: Married    Spouse name: N/A  . Number of children: 0  . Years of education: N/A   Occupational History  . Not on file.   Social History Main Topics  . Smoking status: Never Smoker  . Smokeless tobacco: Never Used  . Alcohol use No  . Drug use: No  . Sexual activity: Not on file   Other Topics Concern  . Not on file   Social History Narrative  . No narrative on file    Review of Systems:    Constitutional: No weight loss, fever or chills Cardiovascular: No chest pain Respiratory: No SOB  Gastrointestinal: See HPI and otherwise negative  Physical Exam:  Vital signs: BP 110/72   Pulse 74   Ht 5' 2.5" (1.588 m)   Wt 145 lb (65.8 kg)   BMI 26.10 kg/m   Constitutional:   Pleasant Caucasian female appears to be in NAD, Well developed, Well nourished, alert and cooperative Head:  Normocephalic and atraumatic. Eyes:   PEERL, EOMI. No icterus. Conjunctiva pink. Ears:  Normal auditory acuity. Neck:  Supple Throat: Oral cavity and pharynx without inflammation, swelling or lesion.  Respiratory: Respirations even and unlabored. Lungs clear to auscultation bilaterally.   No wheezes,  crackles, or rhonchi.  Cardiovascular: Normal S1, S2. No MRG. Regular rate and rhythm. No peripheral edema, cyanosis or pallor.  Gastrointestinal:  Soft, nondistended, mild generalized ttp, No rebound or guarding. Normal bowel sounds. No appreciable masses or hepatomegaly. Rectal:  Not performed.  Msk:  Symmetrical without gross deformities. Without edema, no deformity or joint abnormality.  Neurologic:  Alert and  oriented x4;  grossly normal neurologically.  Skin:   Dry and intact without significant lesions or rashes. Psychiatric: Demonstrates good judgement and reason without abnormal affect or behaviors.  RELEVANT LABS AND IMAGING: CBC    Component Value Date/Time   WBC 8.0 05/07/2017 2248   RBC 4.64 05/07/2017 2248   HGB 14.2  05/07/2017 2248   HCT 42.0 05/07/2017 2248   PLT 307 05/07/2017 2248   MCV 90.5 05/07/2017 2248   MCH 30.6 05/07/2017 2248   MCHC 33.8 05/07/2017 2248   RDW 12.1 05/07/2017 2248    CMP     Component Value Date/Time   NA 135 05/07/2017 2248   K 4.0 05/07/2017 2248   CL 101 05/07/2017 2248   CO2 26 05/07/2017 2248   GLUCOSE 119 (H) 05/07/2017 2248   BUN 8 05/07/2017 2248   CREATININE 0.63 05/07/2017 2248   CALCIUM 9.2 05/07/2017 2248   PROT 7.5 05/07/2017 2248   ALBUMIN 4.2 05/07/2017 2248   AST 18 05/07/2017 2248   ALT 19 05/07/2017 2248   ALKPHOS 72 05/07/2017 2248   BILITOT 1.2 05/07/2017 2248   GFRNONAA >60 05/07/2017 2248   GFRAA >60 05/07/2017 2248   Ct Abdomen Pelvis W Contrast  Result Date: 05/08/2017 CLINICAL DATA:  Abdominal pain EXAM: CT ABDOMEN AND PELVIS WITH CONTRAST TECHNIQUE: Multidetector CT imaging of the abdomen and pelvis was performed using the standard protocol following bolus administration of intravenous contrast. CONTRAST:  138mL ISOVUE-300 IOPAMIDOL (ISOVUE-300) INJECTION 61% COMPARISON:  None. FINDINGS: Lower chest: Lung bases demonstrate no acute consolidation or pleural effusion. Hepatobiliary: No focal liver  abnormality is seen. No gallstones, gallbladder wall thickening, or biliary dilatation. Pancreas: Unremarkable. No pancreatic ductal dilatation or surrounding inflammatory changes. Spleen: Normal in size without focal abnormality. Adrenals/Urinary Tract: Adrenal glands are unremarkable. Kidneys are normal, without renal calculi, focal lesion, or hydronephrosis. Bladder is unremarkable. Stomach/Bowel: Stomach is within normal limits. Appendix appears normal. Questionable mild wall thickening of the distal descending colon. Not much surrounding inflammation. Vascular/Lymphatic: Aortic atherosclerosis. No enlarged abdominal or pelvic lymph nodes. Reproductive: Uterus and bilateral adnexa are unremarkable. Other: Negative for free air or free fluid. Musculoskeletal: No acute or significant osseous findings. IMPRESSION: 1. Collapsed colon versus mild focal colitis of the distal descending colon. 2. There are no other acute abnormalities visualized. Electronically Signed   By: Donavan Foil M.D.   On: 05/08/2017 02:22   Assessment: 1. Abnormal CT the abdomen and pelvis: See above questioning colitis the distal descending colon 2. Diarrhea: Off and on over the past 2-1/2 months with 3 severe episodes of abdominal pain followed by diarrhea and hematochezia 3. Abdominal pain: Cyclical symptoms of severe suprapubic abdominal pain which results in diarrhea and loose stools, colitis visualized on recent CT as above; question infectious versus inflammatory versus ischemic 4. Hematochezia: Cyclical with abdominal pain above  Plan: 1. Prescribed Metronidazole 500 mg 3 times a day 7 days. 2. Prescribe Ciprofloxacin 500 mg twice a day 7 days. 3. If patient is not better after antibiotics above, will consider a CTA due to cyclical symptoms of intense pain followed by diarrhea and bloody bowel movements 4. Will also consider a repeat flexible sigmoidoscopy versus colonoscopy with Dr. Henrene Pastor 5. Patient encouraged to  maintain a low fiber/low residue diet for now. Did provide her with a handout regarding this 6. Prescribed Hyoscyamine sulfate 0.125 mg every 4-6 hours as needed for abdominal cramping 7. Patient to follow in clinic in 3-4 weeks with me. I will have my nurse call the patient in 2 weeks to check in.  Claire Newer, PA-C [facility] 05/11/2017, 12:57 PM  Cc: Shon Baton, MD

## 2017-05-11 NOTE — Progress Notes (Signed)
Initial assessment and plans reviewed 

## 2017-05-11 NOTE — Patient Instructions (Signed)
Please call the office with an update of your symptoms in the next two weeks and ask for Katina Degree, RN.   Your physician has requested that you go to the basement for lab work before leaving today.  We have sent the following medications to your pharmacy for you to pick up at your convenience:  Cipro 500 mg twice a day Flagyl 500 mg three times a day Hyoscyamine 0.125 mg 4-6 hours as needed for pain and cramping  We have given you a low fiber/low residue handout.

## 2017-05-12 ENCOUNTER — Other Ambulatory Visit: Payer: BLUE CROSS/BLUE SHIELD

## 2017-05-12 DIAGNOSIS — R1084 Generalized abdominal pain: Secondary | ICD-10-CM

## 2017-05-12 DIAGNOSIS — R197 Diarrhea, unspecified: Secondary | ICD-10-CM

## 2017-05-12 DIAGNOSIS — K921 Melena: Secondary | ICD-10-CM

## 2017-05-13 ENCOUNTER — Ambulatory Visit: Payer: BLUE CROSS/BLUE SHIELD | Admitting: Nurse Practitioner

## 2017-05-13 LAB — GASTROINTESTINAL PATHOGEN PANEL PCR
C. DIFFICILE TOX A/B, PCR: NOT DETECTED
CAMPYLOBACTER, PCR: NOT DETECTED
CRYPTOSPORIDIUM, PCR: NOT DETECTED
E coli (ETEC) LT/ST PCR: NOT DETECTED
E coli (STEC) stx1/stx2, PCR: NOT DETECTED
E coli 0157, PCR: NOT DETECTED
GIARDIA LAMBLIA, PCR: NOT DETECTED
Norovirus, PCR: NOT DETECTED
ROTAVIRUS, PCR: NOT DETECTED
SALMONELLA, PCR: NOT DETECTED
Shigella, PCR: NOT DETECTED

## 2017-05-13 LAB — OVA AND PARASITE EXAMINATION
CONCENTRATE RESULT:: NONE SEEN
SPECIMEN QUALITY: ADEQUATE
TRICHROME RESULT:: NONE SEEN
VKL: 81066789

## 2017-05-13 LAB — CLOSTRIDIUM DIFFICILE BY PCR: CDIFFPCR: NOT DETECTED

## 2017-05-18 ENCOUNTER — Telehealth: Payer: Self-pay | Admitting: Physician Assistant

## 2017-05-18 ENCOUNTER — Emergency Department (HOSPITAL_COMMUNITY)
Admission: EM | Admit: 2017-05-18 | Discharge: 2017-05-18 | Disposition: A | Discharge status: Home / Self Care | Payer: BLUE CROSS/BLUE SHIELD | Attending: Emergency Medicine | Admitting: Emergency Medicine

## 2017-05-18 ENCOUNTER — Telehealth: Payer: Self-pay | Admitting: Nurse Practitioner

## 2017-05-18 ENCOUNTER — Encounter (HOSPITAL_COMMUNITY): Payer: Self-pay | Admitting: Nurse Practitioner

## 2017-05-18 DIAGNOSIS — Z79899 Other long term (current) drug therapy: Secondary | ICD-10-CM | POA: Diagnosis not present

## 2017-05-18 DIAGNOSIS — Z85850 Personal history of malignant neoplasm of thyroid: Secondary | ICD-10-CM | POA: Diagnosis not present

## 2017-05-18 DIAGNOSIS — Z8541 Personal history of malignant neoplasm of cervix uteri: Secondary | ICD-10-CM | POA: Insufficient documentation

## 2017-05-18 DIAGNOSIS — R197 Diarrhea, unspecified: Secondary | ICD-10-CM

## 2017-05-18 DIAGNOSIS — R1030 Lower abdominal pain, unspecified: Secondary | ICD-10-CM | POA: Diagnosis not present

## 2017-05-18 LAB — CBC
HEMATOCRIT: 42.9 % (ref 36.0–46.0)
Hemoglobin: 14.6 g/dL (ref 12.0–15.0)
MCH: 31 pg (ref 26.0–34.0)
MCHC: 34 g/dL (ref 30.0–36.0)
MCV: 91.1 fL (ref 78.0–100.0)
PLATELETS: 286 10*3/uL (ref 150–400)
RBC: 4.71 MIL/uL (ref 3.87–5.11)
RDW: 12.2 % (ref 11.5–15.5)
WBC: 6 10*3/uL (ref 4.0–10.5)

## 2017-05-18 LAB — URINALYSIS, ROUTINE W REFLEX MICROSCOPIC
Bilirubin Urine: NEGATIVE
GLUCOSE, UA: NEGATIVE mg/dL
Hgb urine dipstick: NEGATIVE
KETONES UR: 5 mg/dL — AB
LEUKOCYTES UA: NEGATIVE
NITRITE: NEGATIVE
PROTEIN: NEGATIVE mg/dL
Specific Gravity, Urine: 1.005 (ref 1.005–1.030)
pH: 8 (ref 5.0–8.0)

## 2017-05-18 LAB — LIPASE, BLOOD: LIPASE: 33 U/L (ref 11–51)

## 2017-05-18 LAB — COMPREHENSIVE METABOLIC PANEL
ALT: 27 U/L (ref 14–54)
AST: 26 U/L (ref 15–41)
Albumin: 4.6 g/dL (ref 3.5–5.0)
Alkaline Phosphatase: 70 U/L (ref 38–126)
Anion gap: 10 (ref 5–15)
BILIRUBIN TOTAL: 1.2 mg/dL (ref 0.3–1.2)
BUN: 6 mg/dL (ref 6–20)
CHLORIDE: 105 mmol/L (ref 101–111)
CO2: 25 mmol/L (ref 22–32)
Calcium: 9.6 mg/dL (ref 8.9–10.3)
Creatinine, Ser: 0.73 mg/dL (ref 0.44–1.00)
Glucose, Bld: 99 mg/dL (ref 65–99)
POTASSIUM: 3.5 mmol/L (ref 3.5–5.1)
Sodium: 140 mmol/L (ref 135–145)
TOTAL PROTEIN: 8 g/dL (ref 6.5–8.1)

## 2017-05-18 LAB — MAGNESIUM: MAGNESIUM: 2.1 mg/dL (ref 1.7–2.4)

## 2017-05-18 MED ORDER — SODIUM CHLORIDE 0.9 % IV BOLUS (SEPSIS)
1000.0000 mL | Freq: Once | INTRAVENOUS | Status: AC
  Administered 2017-05-18: 1000 mL via INTRAVENOUS

## 2017-05-18 MED ORDER — DICYCLOMINE HCL 10 MG PO CAPS
10.0000 mg | ORAL_CAPSULE | Freq: Once | ORAL | Status: AC
  Administered 2017-05-18: 10 mg via ORAL
  Filled 2017-05-18: qty 1

## 2017-05-18 MED ORDER — DICYCLOMINE HCL 10 MG PO CAPS: 20.0000 mg | ORAL_CAPSULE | Freq: Four times a day (QID) | ORAL | 0 refills | Status: AC | PRN

## 2017-05-18 MED ORDER — LOPERAMIDE HCL 2 MG PO CAPS: 2.0000 mg | ORAL_CAPSULE | Freq: Four times a day (QID) | ORAL | 0 refills | Status: DC | PRN

## 2017-05-18 MED ORDER — ONDANSETRON 4 MG PO TBDP
4.0000 mg | ORAL_TABLET | Freq: Once | ORAL | Status: AC
  Administered 2017-05-18: 4 mg via ORAL
  Filled 2017-05-18: qty 1

## 2017-05-18 MED ORDER — ONDANSETRON 4 MG PO TBDP: 4.0000 mg | ORAL_TABLET | Freq: Three times a day (TID) | ORAL | 0 refills | Status: AC | PRN

## 2017-05-18 NOTE — Telephone Encounter (Signed)
Pts husband states his wife is very weak, nauseated and having multiple diarrhea stools. Reports he has to hold her up she is so weak. Discussed with pts husband that he should take her to the ER, she may be dehydrated and require fluids. Husband wanted to know if GI would see her at the ER. Let husband know that she would be seen by the ER doc and they would call in GI if needed. He verbalized understanding.

## 2017-05-18 NOTE — ED Provider Notes (Signed)
[facility] DEPT Provider Note   CSN: [id number] Arrival date & time: 05/18/17  0957     History   Chief Complaint Chief Complaint  Patient presents with  . Abdominal Pain  . Diarrhea  . Fatigue   HPI   Blood pressure (!) 162/74, pulse 69, temperature 98.4 F (36.9 C), resp. rate 12, height 5\' 2"  (1.575 m), weight 65.8 kg (145 lb), SpO2 97 %.  Claire Smith is a 61 y.o. female complaining of Persistent colicky lower abdominal pain and loose stool over the course of the last 3 months. She's currently taking Cipro and Flagyl for a colitis noticed on CT. She states that she is so nauseous and feels generally fatigued and weak that she is having a difficult time taking the antibiotics as directed secondary to nausea. She denies any vomiting but endorses severe nausea. She's also been taking probiotics with little relief. She had a negative C. difficile test several weeks ago. She denies fever, pain, melena, she does state that after multiple episodes of diarrhea she does note some blood streaking. She had 4 episodes of diarrhea last night, the stool is slightly lighter than normal color. She had a colonoscopy by Dr. Henrene Pastor just over a year ago. He states that she's lost 6 pounds over the course of 3 months. She denies easy bruising bleeding, night sweats.   Past Medical History:  Diagnosis Date  . Cervical cancer ([place]) 1986  . HLD (hyperlipidemia)   . MVP (mitral valve prolapse)   . Pleurisy 1982  . Thyroid cancer ([place]) 2007    There are no active problems to display for this patient.   Past Surgical History:  Procedure Laterality Date  . radioactive iodine     thyroid cancer  . THYROIDECTOMY  2007  . TONSILLECTOMY  1978    OB History    No data available       Home Medications    Prior to Admission medications   Medication Sig Start Date End Date Taking? Authorizing Provider  ciprofloxacin (CIPRO) 500 MG tablet Take 1 tablet (500 mg total) by mouth 2 (two)  times daily. 05/11/17  Yes Levin Erp, PA  levothyroxine (SYNTHROID, LEVOTHROID) 100 MCG tablet Take 100 mcg by mouth daily before breakfast.   Yes [provider]  metroNIDAZOLE (FLAGYL) 500 MG tablet Take 1 tablet (500 mg total) by mouth 3 (three) times daily. 05/11/17  Yes Levin Erp, PA  dicyclomine (BENTYL) 10 MG capsule Take 2 capsules (20 mg total) by mouth 4 (four) times daily as needed for spasms. 05/18/17   Awanda Wilcock, Elmyra Ricks, PA-C  hyoscyamine (LEVSIN SL) 0.125 MG SL tablet Place 1 tablet (0.125 mg total) under the tongue every 4 (four) hours as needed for cramping. 05/11/17   Levin Erp, PA  loperamide (IMODIUM) 2 MG capsule Take 1 capsule (2 mg total) by mouth 4 (four) times daily as needed for diarrhea or loose stools. 05/18/17   Frimet Durfee, Elmyra Ricks, PA-C  ondansetron (ZOFRAN ODT) 4 MG disintegrating tablet Take 1 tablet (4 mg total) by mouth every 8 (eight) hours as needed for nausea or vomiting. 05/18/17   Aerilynn Goin, Elmyra Ricks, PA-C    Family History Family History  Problem Relation Age of Onset  . Rheumatic fever Father   . Bladder Cancer Father 67  . Heart Problems Father   . Heart disease Paternal Uncle   . Prostate cancer Paternal Uncle   . Breast cancer Paternal Grandmother     Social History  Social History  Substance Use Topics  . Smoking status: Never Smoker  . Smokeless tobacco: Never Used  . Alcohol use No     Allergies   Augmentin [amoxicillin-pot clavulanate]; Erythromycin; and Sulfa antibiotics   Review of Systems Review of Systems  A complete review of systems was obtained and all systems are negative except as noted in the HPI and PMH.   Physical Exam Updated Vital Signs BP 114/74   Pulse 65   Temp 98.4 F (36.9 C)   Resp 16   Ht 5\' 2"  (1.575 m)   Wt 65.8 kg (145 lb)   SpO2 100%   BMI 26.52 kg/m   Physical Exam  Constitutional: She is oriented to person, place, and time. She appears well-developed and  well-nourished. No distress.  HENT:  Head: Normocephalic and atraumatic.  Mouth/Throat: Oropharynx is clear and moist.  Eyes: Pupils are equal, round, and reactive to light. Conjunctivae and EOM are normal.  Neck: Normal range of motion.  Cardiovascular: Normal rate, regular rhythm and intact distal pulses.   Pulmonary/Chest: Effort normal and breath sounds normal.  Abdominal: Soft. Bowel sounds are normal. She exhibits no distension and no mass. There is no tenderness. There is no rebound and no guarding. No hernia.  Musculoskeletal: Normal range of motion.  Neurological: She is alert and oriented to person, place, and time.  Skin: Capillary refill takes less than 2 seconds. She is not diaphoretic.  Psychiatric: She has a normal mood and affect.  Nursing note and vitals reviewed.    ED Treatments / Results  Labs (all labs ordered are listed, but only abnormal results are displayed) Labs Reviewed  URINALYSIS, ROUTINE W REFLEX MICROSCOPIC - Abnormal; Notable for the following:       Result Value   Ketones, ur 5 (*)    All other components within normal limits  GASTROINTESTINAL PANEL BY PCR, STOOL (REPLACES STOOL CULTURE)  LIPASE, BLOOD  COMPREHENSIVE METABOLIC PANEL  CBC  MAGNESIUM    EKG  EKG Interpretation None       Radiology No results found.  Procedures Procedures (including critical care time)  Medications Ordered in ED Medications  sodium chloride 0.9 % bolus 1,000 mL (1,000 mLs Intravenous New Bag/Given 05/18/17 1453)  dicyclomine (BENTYL) capsule 10 mg (10 mg Oral Given 05/18/17 1257)  ondansetron (ZOFRAN-ODT) disintegrating tablet 4 mg (4 mg Oral Given 05/18/17 1257)  sodium chloride 0.9 % bolus 1,000 mL (0 mLs Intravenous Stopped 05/18/17 1453)     Initial Impression / Assessment and Plan / ED Course  I have reviewed the triage vital signs and the nursing notes.  Pertinent labs & imaging results that were available during my care of the patient were  reviewed by me and considered in my medical decision making (see chart for details).     Vitals:   05/18/17 1330 05/18/17 1400 05/18/17 1430 05/18/17 1500  BP: 112/72 101/74 127/81 114/74  Pulse: 67 88 66 65  Resp:    16  Temp:      SpO2: 99% 100% 98% 100%  Weight:      Height:        Medications  sodium chloride 0.9 % bolus 1,000 mL (1,000 mLs Intravenous New Bag/Given 05/18/17 1453)  dicyclomine (BENTYL) capsule 10 mg (10 mg Oral Given 05/18/17 1257)  ondansetron (ZOFRAN-ODT) disintegrating tablet 4 mg (4 mg Oral Given 05/18/17 1257)  sodium chloride 0.9 % bolus 1,000 mL (0 mLs Intravenous Stopped 05/18/17 1453)    Loletha Carrow W  Fiorillo is 61 y.o. female presenting with Persistent lower abdominal cramping pain with diarrhea onset several months ago. She is currently taking Cipro and Flagyl for colitis. She has severe nausea and it makes it difficult for her to take the antibiotics as directed. Her main complaint today is a severe generalized fatigue. She has no associated chest pain or shortness of breath. Abdominal exam is benign. She has been seen by gastroenterology and does not have a follow-up appointment scheduled. She tested negative for C. difficile several weeks ago.  Blood work reassuring with no significant electrolyte abnormality.  Gastroenterology consult from NP [facility] appreciated:  will expedite an appointment with mid-level and try to set up an appointment with attending Dr. Henrene Pastor.  Urinalysis without abnormality, patient given another liter bolus and will follow with GI and primary care.  Evaluation does not show pathology that would require ongoing emergent intervention or inpatient treatment. Pt is hemodynamically stable and mentating appropriately. Discussed findings and plan with patient/guardian, who agrees with care plan. All questions answered. Return precautions discussed and outpatient follow up given.    Final Clinical Impressions(s) / ED Diagnoses   Final  diagnoses:  Diarrhea, unspecified type    New Prescriptions New Prescriptions   DICYCLOMINE (BENTYL) 10 MG CAPSULE    Take 2 capsules (20 mg total) by mouth 4 (four) times daily as needed for spasms.   LOPERAMIDE (IMODIUM) 2 MG CAPSULE    Take 1 capsule (2 mg total) by mouth 4 (four) times daily as needed for diarrhea or loose stools.   ONDANSETRON (ZOFRAN ODT) 4 MG DISINTEGRATING TABLET    Take 1 tablet (4 mg total) by mouth every 8 (eight) hours as needed for nausea or vomiting.     Monico Blitz, Hershal Coria 05/18/17 [place], Gwenyth Allegra, MD 05/18/17 1723

## 2017-05-18 NOTE — Discharge Instructions (Signed)
Please follow with your primary care doctor in the next 2 days for a check-up. They must obtain records for further management.  ° °Do not hesitate to return to the Emergency Department for any new, worsening or concerning symptoms.  ° °

## 2017-05-18 NOTE — ED Triage Notes (Addendum)
Patient reports having lower abdominal pain, nausea, or diarrhea for the past 2 weeks. Patient was seen in ER and started on medication and referred to GI Per patient for Colitis. X-rays have been done per family. Patient and family reports seeing Dr. Henrene Pastor and being started on Cipro and Flagyl. She reports the symptoms have not subsided. She is unable to tolerated food or drink. They spoke to his office this am due to weakness and symptoms and were told to come back to ER for further evaluation and fluids. Patient is A&O x4, reports being weak and not being able to hardly walk much because of being fatigue and feeling of dehydrations. States she is unable to continue taking pills because she cant hold the food or drink down. She states she has been trying her best to at least drink water and gatorade.

## 2017-05-18 NOTE — ED Notes (Signed)
IV insertion unsuccessful x1

## 2017-05-18 NOTE — Telephone Encounter (Signed)
ED called. Patient presented to ED today with weakness, ongoing abdominal pain and diarrhea. Per EDP her labs are stable. She is hemodynamically stable. ED Rx anti-diarrheal, bentyl and wanted a quick follow up with [place]. She would like to see Dr. Henrene Pastor but he has no openings until November. Worked in with Ellouise Newer who saw her just recently.

## 2017-05-19 ENCOUNTER — Ambulatory Visit: Payer: BLUE CROSS/BLUE SHIELD | Admitting: Physician Assistant

## 2017-05-21 ENCOUNTER — Telehealth: Payer: Self-pay | Admitting: Nurse Practitioner

## 2017-05-21 ENCOUNTER — Encounter: Payer: Self-pay | Admitting: Physician Assistant

## 2017-05-21 ENCOUNTER — Other Ambulatory Visit: Payer: Self-pay | Admitting: Emergency Medicine

## 2017-05-21 ENCOUNTER — Ambulatory Visit (INDEPENDENT_AMBULATORY_CARE_PROVIDER_SITE_OTHER): Payer: BLUE CROSS/BLUE SHIELD | Admitting: Physician Assistant

## 2017-05-21 VITALS — BP 120/80 | HR 70 | Ht 62.0 in | Wt 143.0 lb

## 2017-05-21 DIAGNOSIS — R110 Nausea: Secondary | ICD-10-CM | POA: Diagnosis not present

## 2017-05-21 DIAGNOSIS — R195 Other fecal abnormalities: Secondary | ICD-10-CM | POA: Diagnosis not present

## 2017-05-21 DIAGNOSIS — B370 Candidal stomatitis: Secondary | ICD-10-CM | POA: Diagnosis not present

## 2017-05-21 DIAGNOSIS — R1084 Generalized abdominal pain: Secondary | ICD-10-CM

## 2017-05-21 MED ORDER — LOPERAMIDE HCL 2 MG PO CAPS: 2.0000 mg | ORAL_CAPSULE | Freq: Four times a day (QID) | ORAL | 0 refills | Status: AC | PRN

## 2017-05-21 MED ORDER — FLUCONAZOLE 200 MG PO TABS: 200.0000 mg | ORAL_TABLET | Freq: Every day | ORAL | 0 refills | Status: AC

## 2017-05-21 NOTE — Telephone Encounter (Signed)
Pt is requesting a call back @ [phone]

## 2017-05-21 NOTE — Telephone Encounter (Signed)
Spoke to patient and sent Rx to pharmacy. She states last time her husband went to get Rx he had to wait 2 hours and its better to e-prescibe if possible. I explained to her most of the time at the ER they do not e prescribe. She verbalized understanding.

## 2017-05-21 NOTE — Progress Notes (Signed)
Chief Complaint: Abdominal pain, Diarrhea  HPI:    Claire Smith is a 61 year old Caucasian female with a past medical history as listed below, who returns today for follow-up of her complaint of abdominal pain and diarrhea.    Again patient's last colonoscopy was completed 05/04/16 by Dr. Henrene Pastor. This revealed a 5 mm polyp in the cecum.    Patient was last seen in clinic 05/11/17 and described a recent ER visit 05/07/17 with a finding of mild focal colitis. Patient was started on Ciprofloxacin 500 mg twice a day at that time. At time of that visit, the patient described starting with symptoms on July 9 with extreme lower abdominal pain with loose bowel movements. She then had some "bloody mucousy stool" this stopped after that point and patient had bowels that "just were not right", she continued with a lot of "grumbling in her abdomen" her primary caregiver start her on a probiotic which she took for 32 days with no difference in her crampy lower abdominal pain or loose stools. She went back to see her PCP and was started on Metronidazole 3 times a day for 10 days. She had only taken some of these because they were hard on her. She took 27/30 pills as they made her feel dizzy. She then went to the ER and was prescribed Ciprofloxacin which she had not started at time of her office visit. At that time, it was recommended that she complete an antibiotic course of Metronidazole 500 mg 3 times a day and Ciprofloxacin 500 mg twice a day for 7 days. She was encouraged to maintain a low fiber/ low residue diet. She was also prescribed Hyoscyamine sulfate 0.125 mg every 4-6 hours as needed for abdominal cramping. Stool studies including a GI pathogen panel were negative.    Patient was then seen in the ER 05/18/17 after calling our clinic and complaining of extreme weakness. She described feeling nauseous and generally fatigued and weak. Labs showed a urinalysis with an increased amount of ketones, normal lipase, CMP,  CBC magnesium. Patient was given "2 bags of fluid" and Bentyl as well as Zofran.   Today, the patient tells me that she believes her abdominal pain is much better after finishing the majority of her antibiotics. She tells me she has been trying to take all "5 of the pills" a day but can usually only get 3 or 4 in due to nausea. The patient has about 4 days left of ciprofloxacin and 1 day left of Flagyl. Patient tells me that the night prior to arriving in the ER she had 6 bowel movements which kept her awake through the night. She was so weak that her husband was "holding me up on the toilet". She also tells me that she could speak "no louder than a whisper". Patient continues to describe fatigue and weakness related to her diarrhea and believes that it is multifactorial including being on antibiotics which "just make me feel unwell" as well as multiple bowel movements which "keep me awake at night". Patient has not tried an antidiarrheal and has not tried her Dicyclomine or Hyoscyamine as prescribed by me at time of last office visit.     Currently patient is having 2-4 bowel movements which are "softer in consistency but not watery", they are still formed but "fall apart in the toilet", they are also more of a yellow color than her normal stool. Patient tells me that her rectum is "sore", but she is using Vaseline on  this and just bought some Desitin. Patient was scared to try the Imodium in case she would "block up the bacteria" and has not tried her Dicyclomine. Associated symptoms include occasional nausea.   Patient is also concerned today regarding a white film on her tongue. Patient tells me that she also has altered taste.   Patient denies fever, chills, blood in her stool, melena, weight loss, anorexia, vomiting or symptoms that awaken her at night.   Past Medical History:  Diagnosis Date  . Cervical cancer ([place]) 1986  . HLD (hyperlipidemia)   . MVP (mitral valve prolapse)   . Pleurisy 1982  .  Thyroid cancer ([place]) 2007    Past Surgical History:  Procedure Laterality Date  . radioactive iodine     thyroid cancer  . THYROIDECTOMY  2007  . TONSILLECTOMY  1978    Current Outpatient Prescriptions  Medication Sig Dispense Refill  . Cholecalciferol (VITAMIN D3) 50000 units CAPS Take 50,000 Units by mouth once a week.  11  . ciprofloxacin (CIPRO) 500 MG tablet Take 1 tablet (500 mg total) by mouth 2 (two) times daily. 14 tablet 0  . dicyclomine (BENTYL) 10 MG capsule Take 2 capsules (20 mg total) by mouth 4 (four) times daily as needed for spasms. 20 capsule 0  . hyoscyamine (LEVSIN SL) 0.125 MG SL tablet Place 1 tablet (0.125 mg total) under the tongue every 4 (four) hours as needed for cramping. 30 tablet 0  . levothyroxine (SYNTHROID, LEVOTHROID) 100 MCG tablet Take 100 mcg by mouth See admin instructions. Patient takes Synthroid 100 mcg on  Monday, Tuesday, Wednesday, Friday, Saturday, and Sundays only.    . loperamide (IMODIUM) 2 MG capsule Take 1 capsule (2 mg total) by mouth 4 (four) times daily as needed for diarrhea or loose stools. 25 capsule 0  . metroNIDAZOLE (FLAGYL) 500 MG tablet Take 1 tablet (500 mg total) by mouth 3 (three) times daily. 21 tablet 0  . ondansetron (ZOFRAN ODT) 4 MG disintegrating tablet Take 1 tablet (4 mg total) by mouth every 8 (eight) hours as needed for nausea or vomiting. 20 tablet 0  . fluconazole (DIFLUCAN) 200 MG tablet Take 1 tablet (200 mg total) by mouth daily. 7 tablet 0   Current Facility-Administered Medications  Medication Dose Route Frequency Provider Last Rate Last Dose  . 0.9 %  sodium chloride infusion  500 mL Intravenous Continuous Irene Shipper, MD        Allergies as of 05/21/2017 - Review Complete 05/21/2017  Allergen Reaction Noted  . Augmentin [amoxicillin-pot clavulanate]  05/18/2017  . Erythromycin Nausea Only 01/19/2012  . Sulfa antibiotics  05/18/2017    Family History  Problem Relation Age of Onset  . Rheumatic  fever Father   . Bladder Cancer Father 77  . Heart Problems Father   . Heart disease Paternal Uncle   . Prostate cancer Paternal Uncle   . Breast cancer Paternal Grandmother     Social History   Social History  . Marital status: Married    Spouse name: N/A  . Number of children: 0  . Years of education: N/A   Occupational History  . unemployed    Social History Main Topics  . Smoking status: Never Smoker  . Smokeless tobacco: Never Used  . Alcohol use No  . Drug use: No  . Sexual activity: No   Other Topics Concern  . Not on file   Social History Narrative  . No narrative on file  Review of Systems:    Constitutional: No weight loss, fever or chills Cardiovascular: No chest pain Respiratory: No SOB  Gastrointestinal: See HPI and otherwise negative   Physical Exam:  Vital signs: BP 120/80   Pulse 70   Ht 5\' 2"  (1.575 m)   Wt 143 lb (64.9 kg)   BMI 26.16 kg/m   Constitutional:   Pleasant Caucasian female appears to be in NAD, Well developed, Well nourished, alert and cooperative Mouth: White film covering tongue Respiratory: Respirations even and unlabored. Lungs clear to auscultation bilaterally.   No wheezes, crackles, or rhonchi.  Cardiovascular: Normal S1, S2. No MRG. Regular rate and rhythm. No peripheral edema, cyanosis or pallor.  Gastrointestinal:  Soft, nondistended,mild generalized ttp, No rebound or guarding. Normal bowel sounds. No appreciable masses or hepatomegaly. Psychiatric: Demonstrates good judgement and reason without abnormal affect or behaviors.  RELEVANT LABS AND IMAGING: CBC    Component Value Date/Time   WBC 6.0 05/18/2017 1158   RBC 4.71 05/18/2017 1158   HGB 14.6 05/18/2017 1158   HCT 42.9 05/18/2017 1158   PLT 286 05/18/2017 1158   MCV 91.1 05/18/2017 1158   MCH 31.0 05/18/2017 1158   MCHC 34.0 05/18/2017 1158   RDW 12.2 05/18/2017 1158    CMP     Component Value Date/Time   NA 140 05/18/2017 1158   K 3.5 05/18/2017  1158   CL 105 05/18/2017 1158   CO2 25 05/18/2017 1158   GLUCOSE 99 05/18/2017 1158   BUN 6 05/18/2017 1158   CREATININE 0.73 05/18/2017 1158   CALCIUM 9.6 05/18/2017 1158   PROT 8.0 05/18/2017 1158   ALBUMIN 4.6 05/18/2017 1158   AST 26 05/18/2017 1158   ALT 27 05/18/2017 1158   ALKPHOS 70 05/18/2017 1158   BILITOT 1.2 05/18/2017 1158   GFRNONAA >60 05/18/2017 1158   GFRAA >60 05/18/2017 1158    Assessment: 1. 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 Abdominal pain: Much relieved at this time after antibiotics for colitis seen at time of CT 3. Nausea: Likely related to abdominal pain and antibiotics 4. Oral thrush: Seen at time of exam today  Plan: 1. Prescribed Fluconazole 200 mg daily 7 days for thrush 2. Recommend patient finish her antibiotic regimen as prescribed 3. Recommend the patient use Dicyclomine 10 mg every morning and at night. Did discuss that she could schedule this 20 minutes before meals if this would help her 4. Would recommend patient start Align probiotic once daily for the next 2 months. Did provide her with a coupon and samples. 5. Patient discusses that she would like to go to her nephew's wedding tomorrow but is unsure if she will have loose bowel movements and fatigue. Recommend that she use her Imodium 2 tabs tonight before going to bed to prevent stools and help her sleep, she can also use Imodium tomorrow before going to the wedding 6. Patient was encouraged to maintain a low fiber/ low residue diet for now. She can increase fiber once she finishes antibiotics. 7. Patient to follow in clinic with me in 2-3 weeks or sooner if necessary.  Ellouise Newer, PA-C [facility] 05/21/2017, 3:24 PM  Cc: Shon Baton, MD

## 2017-05-21 NOTE — Progress Notes (Signed)
Assessment and plans reviewed  

## 2017-05-21 NOTE — Patient Instructions (Signed)
We have sent the following medications to your pharmacy for you to pick up at your convenience: Fluconazole 200 mg daily x 7 days  Finish antibiotics.   Use Dicyclomine 10 mg every morning and at night time.   Use Align probiotic once daily for 2 months.  Use Imodium 2 tabs tonight and repeat tomorrow if necessary.

## 2017-06-23 ENCOUNTER — Ambulatory Visit: Payer: BLUE CROSS/BLUE SHIELD | Admitting: Internal Medicine

## 2017-08-05 ENCOUNTER — Telehealth: Payer: Self-pay | Admitting: Physician Assistant

## 2017-08-05 NOTE — Telephone Encounter (Signed)
The pt states she saw Anderson Malta in October and was given abx and has felt better. She has been having some low abd pain x 2 weeks.  She is just worried she will have a flare during Christmas and wants to have abx on hand.

## 2017-08-06 MED ORDER — CIPROFLOXACIN HCL 500 MG PO TABS: 500.0000 mg | ORAL_TABLET | Freq: Two times a day (BID) | ORAL | 0 refills | Status: AC

## 2017-08-06 MED ORDER — METRONIDAZOLE 500 MG PO TABS: 500.0000 mg | ORAL_TABLET | Freq: Three times a day (TID) | ORAL | 0 refills | Status: AC

## 2017-08-06 NOTE — Telephone Encounter (Signed)
Ok to send Cipro 500mg  bid x 7 days and Flagyl 500mg  TID x7 days to pharamacy so patient can have these on hand in case she needs them.  If she does use them she will need to call our office and be seen for follow up. Thanks-JLL

## 2017-08-06 NOTE — Telephone Encounter (Signed)
Patient notified

## 2017-08-26 DIAGNOSIS — M810 Age-related osteoporosis without current pathological fracture: Secondary | ICD-10-CM | POA: Diagnosis not present

## 2017-08-26 DIAGNOSIS — Z0000 Encounter for general adult medical examination without abnormal findings: Secondary | ICD-10-CM | POA: Diagnosis not present

## 2017-08-26 DIAGNOSIS — E038 Other specified hypothyroidism: Secondary | ICD-10-CM | POA: Diagnosis not present

## 2017-08-26 DIAGNOSIS — N3941 Urge incontinence: Secondary | ICD-10-CM | POA: Diagnosis not present

## 2017-09-09 DIAGNOSIS — I700 Atherosclerosis of aorta: Secondary | ICD-10-CM | POA: Diagnosis not present

## 2017-09-09 DIAGNOSIS — E7849 Other hyperlipidemia: Secondary | ICD-10-CM | POA: Diagnosis not present

## 2017-09-09 DIAGNOSIS — R42 Dizziness and giddiness: Secondary | ICD-10-CM | POA: Diagnosis not present

## 2017-09-09 DIAGNOSIS — Z1389 Encounter for screening for other disorder: Secondary | ICD-10-CM | POA: Diagnosis not present

## 2017-09-09 DIAGNOSIS — Z0000 Encounter for general adult medical examination without abnormal findings: Secondary | ICD-10-CM | POA: Diagnosis not present

## 2018-03-10 ENCOUNTER — Other Ambulatory Visit (HOSPITAL_COMMUNITY): Payer: Self-pay | Admitting: Internal Medicine

## 2018-03-10 DIAGNOSIS — M810 Age-related osteoporosis without current pathological fracture: Secondary | ICD-10-CM | POA: Diagnosis not present

## 2018-03-10 DIAGNOSIS — M7989 Other specified soft tissue disorders: Principal | ICD-10-CM

## 2018-03-10 DIAGNOSIS — Z6825 Body mass index (BMI) 25.0-25.9, adult: Secondary | ICD-10-CM | POA: Diagnosis not present

## 2018-03-10 DIAGNOSIS — M79609 Pain in unspecified limb: Secondary | ICD-10-CM | POA: Diagnosis not present

## 2018-03-10 DIAGNOSIS — M79605 Pain in left leg: Secondary | ICD-10-CM

## 2018-03-11 ENCOUNTER — Ambulatory Visit (HOSPITAL_COMMUNITY)
Admission: RE | Admit: 2018-03-11 | Discharge: 2018-03-11 | Disposition: A | Discharge status: Home / Self Care | Payer: BLUE CROSS/BLUE SHIELD | Source: Ambulatory Visit | Attending: Internal Medicine | Admitting: Internal Medicine

## 2018-03-11 DIAGNOSIS — M79605 Pain in left leg: Secondary | ICD-10-CM | POA: Insufficient documentation

## 2018-03-11 DIAGNOSIS — M7989 Other specified soft tissue disorders: Secondary | ICD-10-CM | POA: Insufficient documentation

## 2018-03-11 NOTE — Progress Notes (Signed)
Left lower extremity venous duplex is completed. Preliminary results - There is no evidence of a DVT or Baker's cyst. Toma Copier, RVS  03/11/2018 11:57 AM

## 2018-03-24 DIAGNOSIS — I700 Atherosclerosis of aorta: Secondary | ICD-10-CM | POA: Diagnosis not present

## 2018-03-24 DIAGNOSIS — E7849 Other hyperlipidemia: Secondary | ICD-10-CM | POA: Diagnosis not present

## 2018-03-24 DIAGNOSIS — C73 Malignant neoplasm of thyroid gland: Secondary | ICD-10-CM | POA: Diagnosis not present

## 2018-03-24 DIAGNOSIS — Z8541 Personal history of malignant neoplasm of cervix uteri: Secondary | ICD-10-CM | POA: Diagnosis not present

## 2018-03-24 DIAGNOSIS — E559 Vitamin D deficiency, unspecified: Secondary | ICD-10-CM | POA: Diagnosis not present

## 2018-03-24 DIAGNOSIS — Z1389 Encounter for screening for other disorder: Secondary | ICD-10-CM | POA: Diagnosis not present

## 2018-03-24 DIAGNOSIS — M79605 Pain in left leg: Secondary | ICD-10-CM | POA: Diagnosis not present

## 2018-03-24 DIAGNOSIS — E038 Other specified hypothyroidism: Secondary | ICD-10-CM | POA: Diagnosis not present

## 2018-06-24 ENCOUNTER — Other Ambulatory Visit: Payer: Self-pay | Admitting: Internal Medicine

## 2018-06-24 DIAGNOSIS — Z1231 Encounter for screening mammogram for malignant neoplasm of breast: Secondary | ICD-10-CM

## 2018-07-21 ENCOUNTER — Ambulatory Visit: Payer: BLUE CROSS/BLUE SHIELD

## 2018-08-24 ENCOUNTER — Ambulatory Visit
Admission: RE | Admit: 2018-08-24 | Discharge: 2018-08-24 | Disposition: A | Discharge status: Home / Self Care | Payer: BLUE CROSS/BLUE SHIELD | Source: Ambulatory Visit | Attending: Internal Medicine | Admitting: Internal Medicine

## 2018-08-24 DIAGNOSIS — Z1231 Encounter for screening mammogram for malignant neoplasm of breast: Secondary | ICD-10-CM

## 2018-08-26 ENCOUNTER — Ambulatory Visit: Payer: BLUE CROSS/BLUE SHIELD

## 2018-09-07 DIAGNOSIS — M810 Age-related osteoporosis without current pathological fracture: Secondary | ICD-10-CM | POA: Diagnosis not present

## 2018-09-07 DIAGNOSIS — R5383 Other fatigue: Secondary | ICD-10-CM | POA: Diagnosis not present

## 2018-09-07 DIAGNOSIS — E038 Other specified hypothyroidism: Secondary | ICD-10-CM | POA: Diagnosis not present

## 2018-09-07 DIAGNOSIS — Z0000 Encounter for general adult medical examination without abnormal findings: Secondary | ICD-10-CM | POA: Diagnosis not present

## 2018-09-15 DIAGNOSIS — R600 Localized edema: Secondary | ICD-10-CM | POA: Diagnosis not present

## 2018-09-15 DIAGNOSIS — I700 Atherosclerosis of aorta: Secondary | ICD-10-CM | POA: Diagnosis not present

## 2018-09-15 DIAGNOSIS — R109 Unspecified abdominal pain: Secondary | ICD-10-CM | POA: Diagnosis not present

## 2018-09-15 DIAGNOSIS — Z0000 Encounter for general adult medical examination without abnormal findings: Secondary | ICD-10-CM | POA: Diagnosis not present

## 2018-09-15 DIAGNOSIS — Z1331 Encounter for screening for depression: Secondary | ICD-10-CM | POA: Diagnosis not present

## 2018-09-15 DIAGNOSIS — Z6826 Body mass index (BMI) 26.0-26.9, adult: Secondary | ICD-10-CM | POA: Diagnosis not present

## 2019-03-23 DIAGNOSIS — R109 Unspecified abdominal pain: Secondary | ICD-10-CM | POA: Diagnosis not present

## 2019-03-23 DIAGNOSIS — E039 Hypothyroidism, unspecified: Secondary | ICD-10-CM | POA: Diagnosis not present

## 2019-03-23 DIAGNOSIS — I700 Atherosclerosis of aorta: Secondary | ICD-10-CM | POA: Diagnosis not present

## 2019-03-23 DIAGNOSIS — M810 Age-related osteoporosis without current pathological fracture: Secondary | ICD-10-CM | POA: Diagnosis not present

## 2019-04-06 DIAGNOSIS — E038 Other specified hypothyroidism: Secondary | ICD-10-CM | POA: Diagnosis not present

## 2019-04-06 DIAGNOSIS — C73 Malignant neoplasm of thyroid gland: Secondary | ICD-10-CM | POA: Diagnosis not present

## 2019-04-06 DIAGNOSIS — Z79899 Other long term (current) drug therapy: Secondary | ICD-10-CM | POA: Diagnosis not present

## 2019-11-10 ENCOUNTER — Ambulatory Visit: Payer: BLUE CROSS/BLUE SHIELD

## 2019-11-13 ENCOUNTER — Ambulatory Visit: Payer: Self-pay | Attending: Internal Medicine

## 2019-11-13 ENCOUNTER — Other Ambulatory Visit: Payer: Self-pay

## 2019-11-13 DIAGNOSIS — Z23 Encounter for immunization: Secondary | ICD-10-CM

## 2019-11-13 NOTE — Progress Notes (Signed)
   Covid-19 Vaccination Clinic  Name:  Claire Smith    MRN: [id number] DOB: 06-26-1956  11/13/2019  [name] was observed post Covid-19 immunization for 15 minutes without incident. She was provided with Vaccine Information Sheet and instruction to access the V-Safe system.   [name] was instructed to call 911 with any severe reactions post vaccine: Marland Kitchen Difficulty breathing  . Swelling of face and throat  . A fast heartbeat  . A bad rash all over body  . Dizziness and weakness   Immunizations Administered    Name Date Dose VIS Date Route   Pfizer COVID-19 Vaccine 11/13/2019  2:47 PM 0.3 mL 07/28/2019 Intramuscular   Manufacturer: [place]   Lot: [id number]   [place]: [id number]

## 2019-11-20 ENCOUNTER — Emergency Department (HOSPITAL_COMMUNITY)
Admission: EM | Admit: 2019-11-20 | Discharge: 2019-11-20 | Disposition: A | Discharge status: Home / Self Care | Payer: 59 | Attending: Emergency Medicine | Admitting: Emergency Medicine

## 2019-11-20 ENCOUNTER — Encounter (HOSPITAL_COMMUNITY): Payer: Self-pay

## 2019-11-20 ENCOUNTER — Other Ambulatory Visit: Payer: Self-pay

## 2019-11-20 ENCOUNTER — Emergency Department (HOSPITAL_COMMUNITY): Payer: 59

## 2019-11-20 DIAGNOSIS — S0990XA Unspecified injury of head, initial encounter: Secondary | ICD-10-CM

## 2019-11-20 DIAGNOSIS — Y92010 Kitchen of single-family (private) house as the place of occurrence of the external cause: Secondary | ICD-10-CM | POA: Diagnosis not present

## 2019-11-20 DIAGNOSIS — Z8541 Personal history of malignant neoplasm of cervix uteri: Secondary | ICD-10-CM | POA: Diagnosis not present

## 2019-11-20 DIAGNOSIS — W208XXA Other cause of strike by thrown, projected or falling object, initial encounter: Secondary | ICD-10-CM | POA: Insufficient documentation

## 2019-11-20 DIAGNOSIS — Y999 Unspecified external cause status: Secondary | ICD-10-CM | POA: Insufficient documentation

## 2019-11-20 DIAGNOSIS — Y93E9 Activity, other interior property and clothing maintenance: Secondary | ICD-10-CM | POA: Insufficient documentation

## 2019-11-20 DIAGNOSIS — S0083XA Contusion of other part of head, initial encounter: Secondary | ICD-10-CM | POA: Diagnosis not present

## 2019-11-20 MED ORDER — ONDANSETRON 4 MG PO TBDP: 4.0000 mg | ORAL_TABLET | Freq: Three times a day (TID) | ORAL | 0 refills | Status: AC | PRN

## 2019-11-20 NOTE — ED Triage Notes (Signed)
Pt reports that a large dish fell on the back of her head on Saturday while she and her husband were moving their refrigerator. Denies LOC or bleeding, but states that area is still sore and she just doesn't feel right. She speaks clearly and is able to recall the entire incident. Ambulatory with a steady gate. Denies vomiting or numbness.

## 2019-11-20 NOTE — ED Provider Notes (Signed)
[facility] DEPT Provider Note   CSN: [id number] Arrival date & time: 11/20/19  1858     History Chief Complaint  Patient presents with  . Head Injury    Claire Smith is a 64 y.o. female.  64 year old female with prior medical history as detailed below presents for evaluation following head injury.  Patient reports that 2 days prior she was helping her husband move a refrigerator.  A large ceramic plate fell from the refrigerator striking the back of the head.  She denies LOC.  She denies neck pain.  She complains of persistent low-grade headache ever since the injury.  She denies other injury.  She denies associated nausea, visual change, focal weakness, or other complaint.  The history is provided by the patient and medical records.  Head Injury Location:  Occipital Time since incident:  2 days Mechanism of injury: direct blow   Pain details:    Quality:  Aching   Severity:  Mild   Duration:  2 days   Timing:  Constant   Progression:  Unchanged Chronicity:  New Relieved by:  Nothing Worsened by:  Nothing      Past Medical History:  Diagnosis Date  . Cervical cancer ([place]) 1986  . HLD (hyperlipidemia)   . MVP (mitral valve prolapse)   . Pleurisy 1982  . Thyroid cancer ([place]) 2007    There are no problems to display for this patient.   Past Surgical History:  Procedure Laterality Date  . radioactive iodine     thyroid cancer  . THYROIDECTOMY  2007  . TONSILLECTOMY  1978     OB History   No obstetric history on file.     Family History  Problem Relation Age of Onset  . Rheumatic fever Father   . Bladder Cancer Father 104  . Heart Problems Father   . Heart disease Paternal Uncle   . Prostate cancer Paternal Uncle   . Breast cancer Paternal Grandmother     Social History   Tobacco Use  . Smoking status: Never Smoker  . Smokeless tobacco: Never Used  Substance Use Topics  . Alcohol use: No  . Drug use: No     Home Medications Prior to Admission medications   Medication Sig Start Date End Date Taking? Authorizing Provider  Cholecalciferol (VITAMIN D3) 50000 units CAPS Take 50,000 Units by mouth once a week. 03/11/17   [provider]  ciprofloxacin (CIPRO) 500 MG tablet Take 1 tablet (500 mg total) by mouth 2 (two) times daily. 05/11/17   Levin Erp, PA  ciprofloxacin (CIPRO) 500 MG tablet Take 1 tablet (500 mg total) by mouth 2 (two) times daily. 08/06/17   Levin Erp, PA  dicyclomine (BENTYL) 10 MG capsule Take 2 capsules (20 mg total) by mouth 4 (four) times daily as needed for spasms. 05/18/17   Pisciotta, Elmyra Ricks, PA-C  fluconazole (DIFLUCAN) 200 MG tablet Take 1 tablet (200 mg total) by mouth daily. 05/21/17   Levin Erp, PA  hyoscyamine (LEVSIN SL) 0.125 MG SL tablet Place 1 tablet (0.125 mg total) under the tongue every 4 (four) hours as needed for cramping. 05/11/17   Levin Erp, PA  levothyroxine (SYNTHROID, LEVOTHROID) 100 MCG tablet Take 100 mcg by mouth See admin instructions. Patient takes Synthroid 100 mcg on  Monday, Tuesday, Wednesday, Friday, Saturday, and Sundays only.    [provider]  loperamide (IMODIUM) 2 MG capsule Take 1 capsule (2 mg total) by mouth  4 (four) times daily as needed for diarrhea or loose stools. 05/21/17   Levin Erp, PA  metroNIDAZOLE (FLAGYL) 500 MG tablet Take 1 tablet (500 mg total) by mouth 3 (three) times daily. 05/11/17   Levin Erp, PA  metroNIDAZOLE (FLAGYL) 500 MG tablet Take 1 tablet (500 mg total) by mouth 3 (three) times daily. 08/06/17   Levin Erp, PA  ondansetron (ZOFRAN ODT) 4 MG disintegrating tablet Take 1 tablet (4 mg total) by mouth every 8 (eight) hours as needed for nausea or vomiting. 05/18/17   Pisciotta, Elmyra Ricks, PA-C    Allergies    Augmentin [amoxicillin-pot clavulanate], Erythromycin, and Sulfa antibiotics  Review of Systems   Review of  Systems  All other systems reviewed and are negative.   Physical Exam Updated Vital Signs BP (!) 144/74 (BP Location: Left Arm)   Pulse 79   Temp 98 F (36.7 C) (Oral)   Resp 18   Ht 5\' 3"  (1.6 m)   Wt 64.4 kg   SpO2 100%   BMI 25.15 kg/m   Physical Exam Vitals and nursing note reviewed.  Constitutional:      General: She is not in acute distress.    Appearance: Normal appearance. She is well-developed.  HENT:     Head: Normocephalic.     Comments: Small contusion noted to the posterior occiput Eyes:     Conjunctiva/sclera: Conjunctivae normal.     Pupils: Pupils are equal, round, and reactive to light.  Cardiovascular:     Rate and Rhythm: Normal rate and regular rhythm.     Heart sounds: Normal heart sounds.  Pulmonary:     Effort: Pulmonary effort is normal. No respiratory distress.     Breath sounds: Normal breath sounds.  Abdominal:     General: There is no distension.     Palpations: Abdomen is soft.     Tenderness: There is no abdominal tenderness.  Musculoskeletal:        General: No deformity. Normal range of motion.     Cervical back: Normal range of motion and neck supple.  Skin:    General: Skin is warm and dry.  Neurological:     General: No focal deficit present.     Mental Status: She is alert and oriented to person, place, and time. Mental status is at baseline.     Cranial Nerves: No cranial nerve deficit.     Sensory: No sensory deficit.     Motor: No weakness.     Coordination: Coordination normal.     ED Results / Procedures / Treatments   Labs (all labs ordered are listed, but only abnormal results are displayed) Labs Reviewed - No data to display  EKG None  Radiology CT Head Wo Contrast  Result Date: 11/20/2019 CLINICAL DATA:  Heavy object fell on head.  Headache. EXAM: CT HEAD WITHOUT CONTRAST TECHNIQUE: Contiguous axial images were obtained from the base of the skull through the vertex without intravenous contrast. COMPARISON:   None. FINDINGS: Brain: No acute intracranial abnormality. Specifically, no hemorrhage, hydrocephalus, mass lesion, acute infarction, or significant intracranial injury. Vascular: No hyperdense vessel or unexpected calcification. Skull: No acute calvarial abnormality. Sinuses/Orbits: Visualized paranasal sinuses and mastoids clear. Orbital soft tissues unremarkable. Other: None IMPRESSION: Normal study. Electronically Signed   By: Rolm Baptise M.D.   On: 11/20/2019 21:52    Procedures Procedures (including critical care time)  Medications Ordered in ED Medications - No data to display  ED Course  I have reviewed the triage vital signs and the nursing notes.  Pertinent labs & imaging results that were available during my care of the patient were reviewed by me and considered in my medical decision making (see chart for details).    MDM Rules/Calculators/A&P                      MDM  Screen complete  Claire Smith was evaluated in Emergency Department on 11/20/2019 for the symptoms described in the history of present illness. She was evaluated in the context of the global COVID-19 pandemic, which necessitated consideration that the patient might be at risk for infection with the SARS-CoV-2 virus that causes COVID-19. Institutional protocols and algorithms that pertain to the evaluation of patients at risk for COVID-19 are in a state of rapid change based on information released by regulatory bodies including the CDC and federal and state organizations. These policies and algorithms were followed during the patient's care in the ED.   Patient is presenting for evaluation of headache status post recent head injury.  CT imaging does not reveal significant intracranial injury.  Her exam is not suggestive of significant traumatic injury.  She appears to be appropriate for discharge following her ED evaluation.  Patient does understand need for close follow-up.  Strict return precautions  given and understood.   Final Clinical Impression(s) / ED Diagnoses Final diagnoses:  Injury of head, initial encounter    Rx / DC Orders ED Discharge Orders    None       Valarie Merino, MD 11/20/19 2203

## 2019-11-20 NOTE — Discharge Instructions (Addendum)
Please return for any problem.  Follow-up with your regular care provider as instructed. °

## 2019-12-12 ENCOUNTER — Ambulatory Visit: Payer: 59 | Attending: Internal Medicine

## 2019-12-12 DIAGNOSIS — Z23 Encounter for immunization: Secondary | ICD-10-CM

## 2019-12-12 NOTE — Progress Notes (Signed)
   Covid-19 Vaccination Clinic  Name:  Claire Smith    MRN: [id number] DOB: Oct 12, 1955  12/12/2019  [name] was observed post Covid-19 immunization for 30 minutes based on pre-vaccination screening without incident. She was provided with Vaccine Information Sheet and instruction to access the V-Safe system.   [name] was instructed to call 911 with any severe reactions post vaccine: Marland Kitchen Difficulty breathing  . Swelling of face and throat  . A fast heartbeat  . A bad rash all over body  . Dizziness and weakness   Immunizations Administered    Name Date Dose VIS Date Route   Pfizer COVID-19 Vaccine 12/12/2019  2:19 PM 0.3 mL 10/11/2018 Intramuscular   Manufacturer: [place]   Lot: [id number]   [place]: [id number]

## 2020-03-19 ENCOUNTER — Other Ambulatory Visit: Payer: Self-pay | Admitting: Internal Medicine

## 2020-03-19 DIAGNOSIS — Z1231 Encounter for screening mammogram for malignant neoplasm of breast: Secondary | ICD-10-CM

## 2020-03-27 ENCOUNTER — Ambulatory Visit: Payer: 59

## 2020-04-10 ENCOUNTER — Ambulatory Visit: Payer: 59

## 2020-04-24 ENCOUNTER — Ambulatory Visit: Payer: 59

## 2020-06-12 ENCOUNTER — Other Ambulatory Visit: Payer: Self-pay

## 2020-06-12 ENCOUNTER — Ambulatory Visit
Admission: RE | Admit: 2020-06-12 | Discharge: 2020-06-12 | Disposition: A | Discharge status: Home / Self Care | Payer: 59 | Source: Ambulatory Visit | Attending: Internal Medicine | Admitting: Internal Medicine

## 2020-06-12 DIAGNOSIS — Z1231 Encounter for screening mammogram for malignant neoplasm of breast: Secondary | ICD-10-CM

## 2021-05-13 ENCOUNTER — Encounter: Payer: Self-pay | Admitting: Internal Medicine

## 2021-08-02 IMAGING — MG DIGITAL SCREENING BILAT W/ TOMO W/ CAD
6 of 10 series · 6 of 30 positions shown · non-contrast
Comparison: Previous exam(s).

CLINICAL DATA: Screening.

EXAM:
DIGITAL SCREENING BILATERAL MAMMOGRAM WITH TOMO AND CAD

[L MLO synth-2D (1 of 2)]
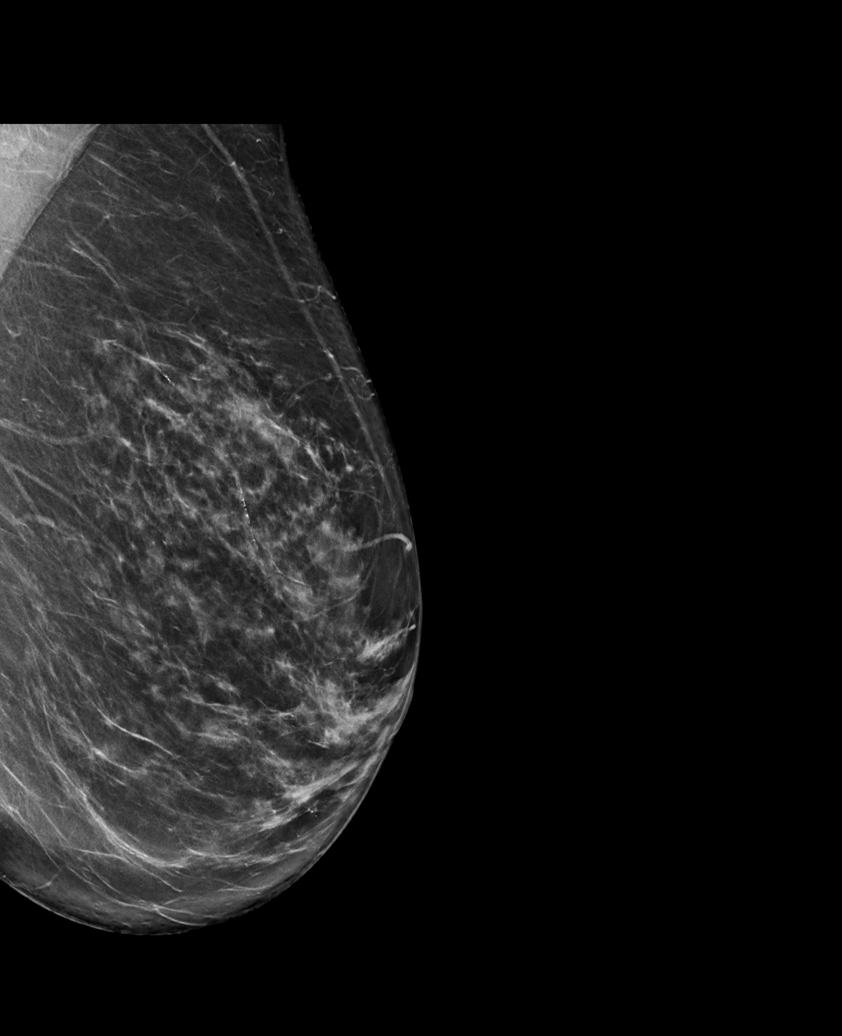

[R CC synth-2D]
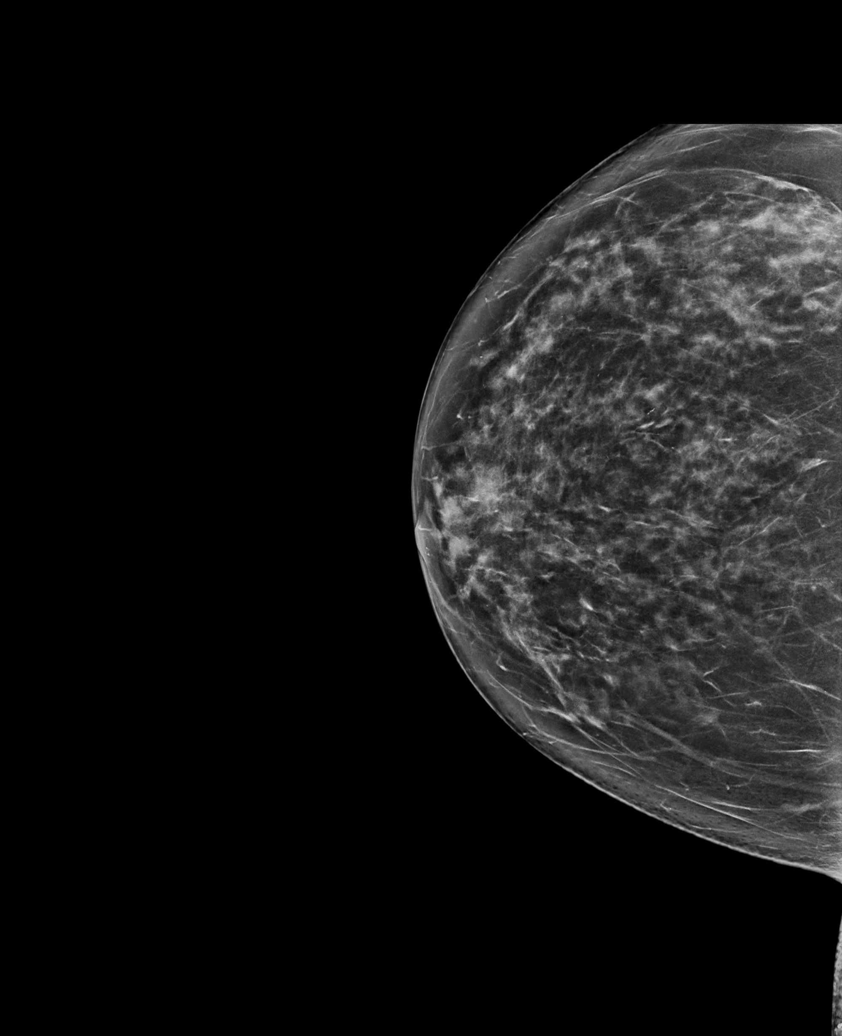

[L CC synth-2D]
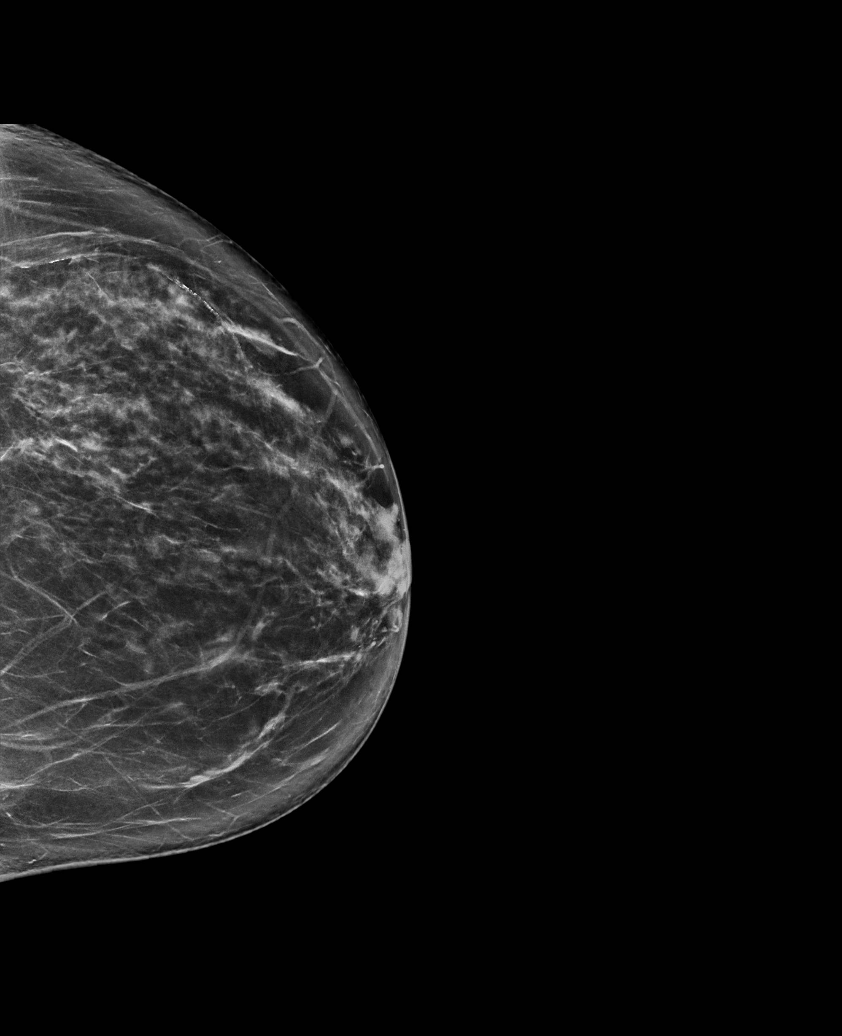

[L MLO synth-2D (2 of 2)]
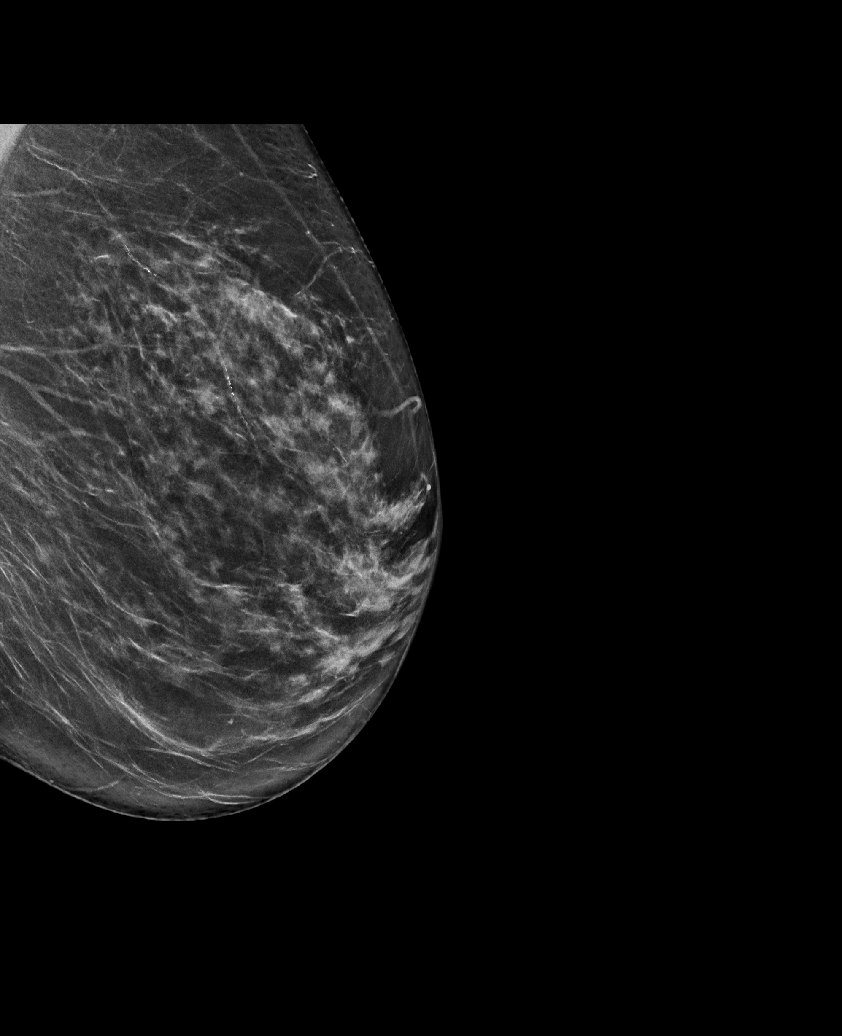

[R MLO synth-2D]
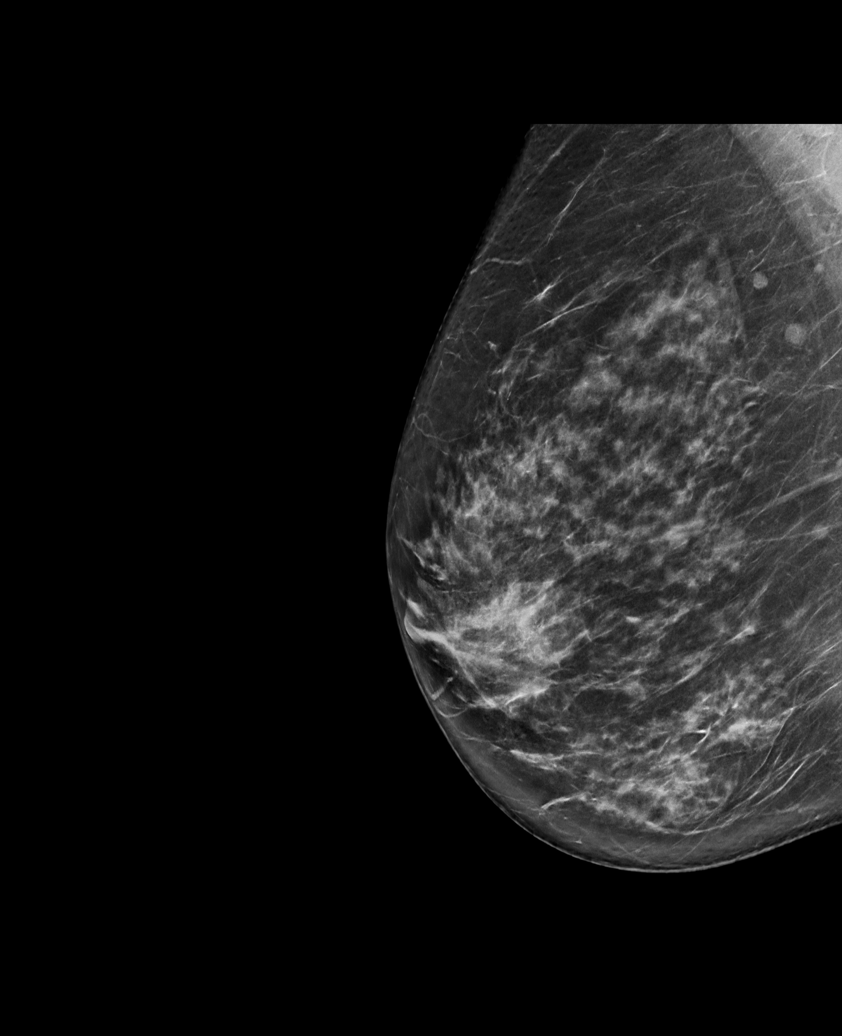

[L MLO tomo · tomo slice 40/79.0]
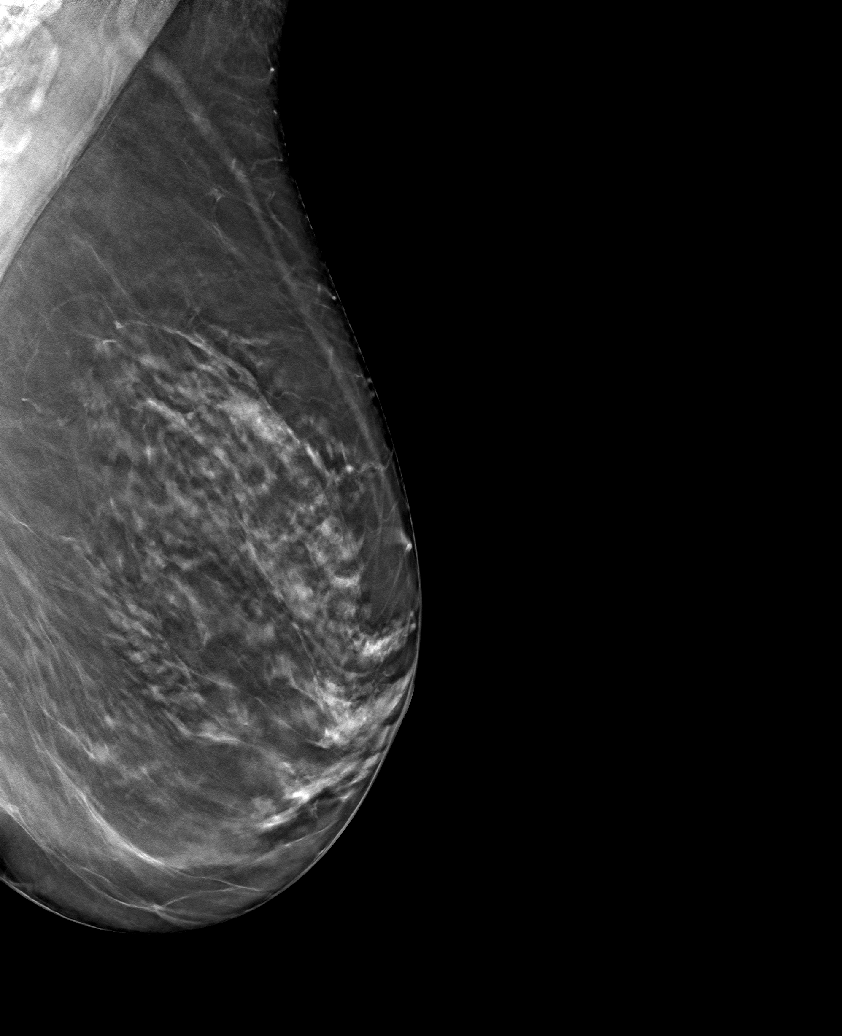

[6 of 30 positions shown; findings below may reference images not displayed]

ACR Breast Density Category c: The breast tissue is heterogeneously
dense, which may obscure small masses.
FINDINGS: There are no findings suspicious for malignancy. Images were
processed with CAD.
IMPRESSION: No mammographic evidence of malignancy. A result letter of this
screening mammogram will be mailed directly to the patient.

RECOMMENDATION:
Screening mammogram in one year. (Code:[id number])

BI-RADS CATEGORY  1: Negative.

## 2022-10-12 ENCOUNTER — Other Ambulatory Visit: Payer: Self-pay | Admitting: Internal Medicine

## 2022-10-12 DIAGNOSIS — Z0000 Encounter for general adult medical examination without abnormal findings: Secondary | ICD-10-CM

## 2023-08-25 ENCOUNTER — Ambulatory Visit: Payer: Self-pay

## 2023-09-22 ENCOUNTER — Ambulatory Visit: Payer: Self-pay

## 2024-01-04 ENCOUNTER — Other Ambulatory Visit: Payer: Self-pay | Admitting: Internal Medicine

## 2024-01-04 DIAGNOSIS — Z1231 Encounter for screening mammogram for malignant neoplasm of breast: Secondary | ICD-10-CM

## 2024-01-19 ENCOUNTER — Ambulatory Visit: Payer: Self-pay

## 2024-01-19 ENCOUNTER — Ambulatory Visit
Admission: RE | Admit: 2024-01-19 | Discharge: 2024-01-19 | Disposition: A | Discharge status: Home / Self Care | Payer: Self-pay | Source: Ambulatory Visit | Attending: Internal Medicine | Admitting: Internal Medicine

## 2024-01-19 DIAGNOSIS — Z1231 Encounter for screening mammogram for malignant neoplasm of breast: Secondary | ICD-10-CM

## 2024-09-13 ENCOUNTER — Encounter: Payer: Self-pay | Admitting: Gastroenterology
